# Patient Record
Sex: Female | Born: 1960
Health system: Southern US, Community
[De-identification: ages and names within clinical notes are randomized; demographics above are authoritative.]

## PROBLEM LIST (undated history)

## (undated) DIAGNOSIS — I10 Essential (primary) hypertension: Secondary | ICD-10-CM

## (undated) DIAGNOSIS — E7800 Pure hypercholesterolemia, unspecified: Secondary | ICD-10-CM

## (undated) HISTORY — PX: CYSTOSTOMY W/ BLADDER BIOPSY: SHX1431

## (undated) HISTORY — PX: CHOLECYSTECTOMY: SHX55

## (undated) HISTORY — PX: TONSILLECTOMY: SUR1361

---

## 1997-08-09 ENCOUNTER — Ambulatory Visit (HOSPITAL_COMMUNITY): Admission: RE | Admit: 1997-08-09 | Discharge: 1997-08-09 | Payer: Self-pay | Admitting: *Deleted

## 1998-08-15 ENCOUNTER — Other Ambulatory Visit: Admission: RE | Admit: 1998-08-15 | Discharge: 1998-08-15 | Payer: Self-pay | Admitting: Gynecology

## 1999-08-16 ENCOUNTER — Other Ambulatory Visit: Admission: RE | Admit: 1999-08-16 | Discharge: 1999-08-16 | Payer: Self-pay | Admitting: Gynecology

## 1999-09-13 ENCOUNTER — Ambulatory Visit (HOSPITAL_COMMUNITY): Admission: RE | Admit: 1999-09-13 | Discharge: 1999-09-13 | Payer: Self-pay | Admitting: *Deleted

## 2000-08-14 ENCOUNTER — Other Ambulatory Visit: Admission: RE | Admit: 2000-08-14 | Discharge: 2000-08-14 | Payer: Self-pay | Admitting: Gynecology

## 2001-09-08 ENCOUNTER — Other Ambulatory Visit: Admission: RE | Admit: 2001-09-08 | Discharge: 2001-09-08 | Payer: Self-pay | Admitting: Gynecology

## 2002-07-27 ENCOUNTER — Ambulatory Visit (HOSPITAL_COMMUNITY): Admission: RE | Admit: 2002-07-27 | Discharge: 2002-07-27 | Payer: Self-pay | Admitting: Family Medicine

## 2002-07-27 ENCOUNTER — Encounter: Payer: Self-pay | Admitting: Family Medicine

## 2003-01-13 ENCOUNTER — Other Ambulatory Visit: Admission: RE | Admit: 2003-01-13 | Discharge: 2003-01-13 | Payer: Self-pay | Admitting: Gynecology

## 2003-07-23 ENCOUNTER — Encounter: Admission: RE | Admit: 2003-07-23 | Discharge: 2003-07-23 | Payer: Self-pay | Admitting: Family Medicine

## 2004-09-03 ENCOUNTER — Other Ambulatory Visit: Admission: RE | Admit: 2004-09-03 | Discharge: 2004-09-03 | Payer: Self-pay | Admitting: Obstetrics and Gynecology

## 2006-10-27 ENCOUNTER — Ambulatory Visit (HOSPITAL_COMMUNITY): Admission: RE | Admit: 2006-10-27 | Discharge: 2006-10-27 | Payer: Self-pay | Admitting: Family Medicine

## 2006-10-27 ENCOUNTER — Encounter (INDEPENDENT_AMBULATORY_CARE_PROVIDER_SITE_OTHER): Payer: Self-pay | Admitting: Family Medicine

## 2006-10-27 ENCOUNTER — Ambulatory Visit: Payer: Self-pay | Admitting: *Deleted

## 2007-05-08 ENCOUNTER — Ambulatory Visit: Payer: Self-pay | Admitting: Vascular Surgery

## 2007-05-08 ENCOUNTER — Ambulatory Visit (HOSPITAL_COMMUNITY): Admission: RE | Admit: 2007-05-08 | Discharge: 2007-05-08 | Payer: Self-pay | Admitting: Family Medicine

## 2008-08-15 ENCOUNTER — Encounter: Admission: RE | Admit: 2008-08-15 | Discharge: 2008-08-15 | Payer: Self-pay | Admitting: Specialist

## 2009-03-17 ENCOUNTER — Encounter: Admission: RE | Admit: 2009-03-17 | Discharge: 2009-03-17 | Payer: Self-pay | Admitting: Family Medicine

## 2009-04-06 ENCOUNTER — Ambulatory Visit (HOSPITAL_COMMUNITY): Admission: RE | Admit: 2009-04-06 | Discharge: 2009-04-07 | Payer: Self-pay | Admitting: General Surgery

## 2009-04-06 ENCOUNTER — Encounter (INDEPENDENT_AMBULATORY_CARE_PROVIDER_SITE_OTHER): Payer: Self-pay | Admitting: General Surgery

## 2010-07-31 LAB — COMPREHENSIVE METABOLIC PANEL
BUN: 9 mg/dL (ref 6–23)
CO2: 29 mEq/L (ref 19–32)
Calcium: 9.3 mg/dL (ref 8.4–10.5)
Chloride: 106 mEq/L (ref 96–112)
Creatinine, Ser: 0.67 mg/dL (ref 0.4–1.2)
GFR calc non Af Amer: >60 mL/min (ref >60)
Total Bilirubin: 0.5 mg/dL (ref 0.3–1.2)

## 2010-07-31 LAB — CBC
HCT: 36.5 % (ref 36.0–46.0)
MCHC: 32.5 g/dL (ref 30.0–36.0)
MCV: 85 fL (ref 78.0–100.0)
Platelets: 388 10*3/uL (ref 150–400)
RBC: 4.3 MIL/uL (ref 3.87–5.11)
WBC: 5.8 10*3/uL (ref 4.0–10.5)

## 2010-07-31 LAB — DIFFERENTIAL
Basophils Absolute: 0 10*3/uL (ref 0.0–0.1)
Lymphocytes Relative: 38 % (ref 12–46)
Lymphs Abs: 2.2 10*3/uL (ref 0.7–4.0)
Neutro Abs: 3 10*3/uL (ref 1.7–7.7)
Neutrophils Relative %: 52 % (ref 43–77)

## 2011-07-04 ENCOUNTER — Other Ambulatory Visit: Payer: Self-pay | Admitting: Gastroenterology

## 2012-02-06 ENCOUNTER — Other Ambulatory Visit: Payer: Self-pay | Admitting: Obstetrics and Gynecology

## 2012-02-06 DIAGNOSIS — Z1231 Encounter for screening mammogram for malignant neoplasm of breast: Secondary | ICD-10-CM

## 2012-03-11 ENCOUNTER — Ambulatory Visit
Admission: RE | Admit: 2012-03-11 | Discharge: 2012-03-11 | Disposition: A | Discharge status: Home / Self Care | Payer: PRIVATE HEALTH INSURANCE | Source: Ambulatory Visit | Attending: Obstetrics and Gynecology | Admitting: Obstetrics and Gynecology

## 2012-03-11 DIAGNOSIS — Z1231 Encounter for screening mammogram for malignant neoplasm of breast: Secondary | ICD-10-CM

## 2012-03-16 ENCOUNTER — Other Ambulatory Visit: Payer: Self-pay | Admitting: Obstetrics and Gynecology

## 2012-03-16 DIAGNOSIS — R928 Other abnormal and inconclusive findings on diagnostic imaging of breast: Secondary | ICD-10-CM

## 2012-03-31 ENCOUNTER — Ambulatory Visit
Admission: RE | Admit: 2012-03-31 | Discharge: 2012-03-31 | Disposition: A | Discharge status: Home / Self Care | Payer: PRIVATE HEALTH INSURANCE | Source: Ambulatory Visit | Attending: Obstetrics and Gynecology | Admitting: Obstetrics and Gynecology

## 2012-03-31 DIAGNOSIS — R928 Other abnormal and inconclusive findings on diagnostic imaging of breast: Secondary | ICD-10-CM

## 2012-10-13 ENCOUNTER — Other Ambulatory Visit: Payer: Self-pay | Admitting: Obstetrics and Gynecology

## 2012-10-13 DIAGNOSIS — R921 Mammographic calcification found on diagnostic imaging of breast: Secondary | ICD-10-CM

## 2013-02-02 ENCOUNTER — Ambulatory Visit
Admission: RE | Admit: 2013-02-02 | Discharge: 2013-02-02 | Disposition: A | Discharge status: Home / Self Care | Payer: PRIVATE HEALTH INSURANCE | Source: Ambulatory Visit | Attending: Obstetrics and Gynecology | Admitting: Obstetrics and Gynecology

## 2013-02-02 DIAGNOSIS — R921 Mammographic calcification found on diagnostic imaging of breast: Secondary | ICD-10-CM

## 2013-02-24 ENCOUNTER — Ambulatory Visit: Payer: Self-pay | Admitting: Podiatry

## 2013-03-23 ENCOUNTER — Other Ambulatory Visit: Payer: Self-pay | Admitting: Otolaryngology

## 2013-03-23 DIAGNOSIS — K148 Other diseases of tongue: Secondary | ICD-10-CM

## 2013-03-24 ENCOUNTER — Ambulatory Visit
Admission: RE | Admit: 2013-03-24 | Discharge: 2013-03-24 | Disposition: A | Discharge status: Home / Self Care | Payer: BC Managed Care – PPO | Source: Ambulatory Visit | Attending: Otolaryngology | Admitting: Otolaryngology

## 2013-03-24 DIAGNOSIS — K148 Other diseases of tongue: Secondary | ICD-10-CM

## 2013-03-24 MED ORDER — IOHEXOL 300 MG/ML  SOLN
75.0000 mL | Freq: Once | INTRAMUSCULAR | Status: AC | PRN
  Administered 2013-03-24: 75 mL via INTRAVENOUS

## 2013-05-14 ENCOUNTER — Other Ambulatory Visit: Payer: Self-pay | Admitting: Family Medicine

## 2013-05-14 DIAGNOSIS — I7090 Unspecified atherosclerosis: Secondary | ICD-10-CM

## 2013-05-18 ENCOUNTER — Ambulatory Visit
Admission: RE | Admit: 2013-05-18 | Discharge: 2013-05-18 | Disposition: A | Discharge status: Home / Self Care | Payer: BC Managed Care – PPO | Source: Ambulatory Visit | Attending: Family Medicine | Admitting: Family Medicine

## 2013-05-18 DIAGNOSIS — I7090 Unspecified atherosclerosis: Secondary | ICD-10-CM

## 2013-10-26 ENCOUNTER — Other Ambulatory Visit: Payer: Self-pay | Admitting: Family Medicine

## 2013-10-26 DIAGNOSIS — R921 Mammographic calcification found on diagnostic imaging of breast: Secondary | ICD-10-CM

## 2013-11-05 ENCOUNTER — Inpatient Hospital Stay: Admission: RE | Admit: 2013-11-05 | Payer: BC Managed Care – PPO | Source: Ambulatory Visit

## 2014-03-11 ENCOUNTER — Ambulatory Visit
Admission: RE | Admit: 2014-03-11 | Discharge: 2014-03-11 | Disposition: A | Discharge status: Home / Self Care | Payer: BC Managed Care – PPO | Source: Ambulatory Visit | Attending: Family Medicine | Admitting: Family Medicine

## 2014-03-11 DIAGNOSIS — R921 Mammographic calcification found on diagnostic imaging of breast: Secondary | ICD-10-CM

## 2015-10-01 ENCOUNTER — Emergency Department (HOSPITAL_COMMUNITY): Payer: BLUE CROSS/BLUE SHIELD

## 2015-10-01 ENCOUNTER — Emergency Department (HOSPITAL_COMMUNITY)
Admission: EM | Admit: 2015-10-01 | Discharge: 2015-10-02 | Disposition: A | Discharge status: Home / Self Care | Payer: BLUE CROSS/BLUE SHIELD | Attending: Emergency Medicine | Admitting: Emergency Medicine

## 2015-10-01 ENCOUNTER — Encounter (HOSPITAL_COMMUNITY): Payer: Self-pay | Admitting: Emergency Medicine

## 2015-10-01 DIAGNOSIS — Z79899 Other long term (current) drug therapy: Secondary | ICD-10-CM | POA: Insufficient documentation

## 2015-10-01 DIAGNOSIS — R109 Unspecified abdominal pain: Secondary | ICD-10-CM | POA: Diagnosis present

## 2015-10-01 DIAGNOSIS — I10 Essential (primary) hypertension: Secondary | ICD-10-CM | POA: Diagnosis not present

## 2015-10-01 DIAGNOSIS — K5900 Constipation, unspecified: Secondary | ICD-10-CM | POA: Diagnosis not present

## 2015-10-01 HISTORY — DX: Essential (primary) hypertension: I10

## 2015-10-01 HISTORY — DX: Pure hypercholesterolemia, unspecified: E78.00

## 2015-10-01 LAB — CBC WITH DIFFERENTIAL/PLATELET
BASOS ABS: 0 10*3/uL (ref 0.0–0.1)
Basophils Relative: 0 %
Eosinophils Absolute: 0 10*3/uL (ref 0.0–0.7)
Eosinophils Relative: 0 %
HCT: 39.2 % (ref 36.0–46.0)
Hemoglobin: 13.9 g/dL (ref 12.0–15.0)
LYMPHS ABS: 1.7 10*3/uL (ref 0.7–4.0)
LYMPHS PCT: 10 %
MCH: 29.5 pg (ref 26.0–34.0)
MCHC: 35.5 g/dL (ref 30.0–36.0)
MCV: 83.2 fL (ref 78.0–100.0)
Monocytes Absolute: 0.7 10*3/uL (ref 0.1–1.0)
Monocytes Relative: 4 %
NEUTROS ABS: 14.3 10*3/uL — AB (ref 1.7–7.7)
NEUTROS PCT: 86 %
PLATELETS: 329 10*3/uL (ref 150–400)
RBC: 4.71 MIL/uL (ref 3.87–5.11)
RDW: 12.7 % (ref 11.5–15.5)
WBC: 16.7 10*3/uL — AB (ref 4.0–10.5)

## 2015-10-01 LAB — COMPREHENSIVE METABOLIC PANEL
ALT: 21 U/L (ref 14–54)
AST: 29 U/L (ref 15–41)
Albumin: 4.2 g/dL (ref 3.5–5.0)
Alkaline Phosphatase: 62 U/L (ref 38–126)
Anion gap: 10 (ref 5–15)
BUN: 19 mg/dL (ref 6–20)
CHLORIDE: 103 mmol/L (ref 101–111)
CO2: 24 mmol/L (ref 22–32)
Calcium: 9.3 mg/dL (ref 8.9–10.3)
Creatinine, Ser: 0.81 mg/dL (ref 0.44–1.00)
GFR calc Af Amer: >60 mL/min (ref >60)
Glucose, Bld: 99 mg/dL (ref 65–99)
POTASSIUM: 4.1 mmol/L (ref 3.5–5.1)
SODIUM: 137 mmol/L (ref 135–145)
Total Bilirubin: 0.9 mg/dL (ref 0.3–1.2)
Total Protein: 7.4 g/dL (ref 6.5–8.1)

## 2015-10-01 LAB — URINALYSIS, ROUTINE W REFLEX MICROSCOPIC
Bilirubin Urine: NEGATIVE
Glucose, UA: NEGATIVE mg/dL
Ketones, ur: NEGATIVE mg/dL
NITRITE: NEGATIVE
PROTEIN: NEGATIVE mg/dL
SPECIFIC GRAVITY, URINE: 1.01 (ref 1.005–1.030)
pH: 7 (ref 5.0–8.0)

## 2015-10-01 LAB — URINE MICROSCOPIC-ADD ON

## 2015-10-01 LAB — LIPASE, BLOOD: LIPASE: 22 U/L (ref 11–51)

## 2015-10-01 MED ORDER — FLEET ENEMA 7-19 GM/118ML RE ENEM
1.0000 | ENEMA | Freq: Once | RECTAL | Status: AC
  Administered 2015-10-01: 1 via RECTAL
  Filled 2015-10-01: qty 1

## 2015-10-01 MED ORDER — MORPHINE SULFATE (PF) 4 MG/ML IV SOLN
4.0000 mg | Freq: Once | INTRAVENOUS | Status: AC
Start: 2015-10-01 — End: 2015-10-01
  Administered 2015-10-01: 4 mg via INTRAVENOUS
  Filled 2015-10-01: qty 1

## 2015-10-01 MED ORDER — PROMETHAZINE HCL 25 MG PO TABS
25.0000 mg | ORAL_TABLET | Freq: Once | ORAL | Status: AC
  Administered 2015-10-01: 25 mg via ORAL
  Filled 2015-10-01: qty 1

## 2015-10-01 MED ORDER — MORPHINE SULFATE (PF) 4 MG/ML IV SOLN
4.0000 mg | Freq: Once | INTRAVENOUS | Status: AC
  Administered 2015-10-01: 4 mg via INTRAVENOUS
  Filled 2015-10-01: qty 1

## 2015-10-01 MED ORDER — MAGNESIUM CITRATE PO SOLN
1.0000 | Freq: Once | ORAL | Status: AC
  Administered 2015-10-01: 1 via ORAL
  Filled 2015-10-01: qty 296

## 2015-10-01 MED ORDER — ONDANSETRON 4 MG PO TBDP
4.0000 mg | ORAL_TABLET | Freq: Once | ORAL | Status: AC
  Administered 2015-10-01: 4 mg via ORAL
  Filled 2015-10-01: qty 1

## 2015-10-01 MED ORDER — MAGNESIUM CITRATE PO SOLN
1.0000 | Freq: Once | ORAL | Status: DC
  Filled 2015-10-01: qty 296

## 2015-10-01 NOTE — ED Notes (Signed)
Bed: [facility] Expected date:  Expected time:  Means of arrival:  Comments: 

## 2015-10-01 NOTE — ED Provider Notes (Signed)
CSN: [id number]     Arrival date & time 10/01/15  1647 History   First MD Initiated Contact with Patient 10/01/15 1722     Chief Complaint  Patient presents with  . Constipation  . Abdominal Pain   HPI  Gina Watson is a 55 year old female with PMHx of HTN and PSHx of cholecystectomy and cesarean x 2 presenting with abdominal bloating, constipation and nausea. She reports her last bowel movement was two days ago. She states that her abdomen feels full but it is not painful. She has had an urge to have a bowel movement all morning with significant straining. She states that no stool has passed and she is now having rectal pain and fullness. Denies blood from rectum. She took a stool softener and OTC laxative which did not relieve her symptoms. She also complains of difficulty urinating. She states that she is able to urinate with straining. Denies dysuria or hematuria. Reports history of hemorrhoids and constipation. She does not take daily constipation medications. Denies fevers, chills, dizziness, syncope, chest pain, SOB, vomiting. She has never followed with a gastroenterologist.   Past Medical History  Diagnosis Date  . Hypertension   . High cholesterol    Past Surgical History  Procedure Laterality Date  . Cesarean section    . Tonsillectomy    . Cystostomy w/ bladder biopsy    . Cholecystectomy     History reviewed. No pertinent family history. Social History  Substance Use Topics  . Smoking status: None  . Smokeless tobacco: None  . Alcohol Use: None   OB History    No data available     Review of Systems  All other systems reviewed and are negative.     Allergies  Hydrocodone; Penicillins; and Sulfa antibiotics  Home Medications   Prior to Admission medications   Medication Sig Start Date End Date Taking? Authorizing Provider  Bisacodyl (LAXATIVE PO) Take 1 tablet by mouth daily as needed (constipation).   Yes Historical Provider, MD  BYSTOLIC 5 MG tablet Take 5 mg  by mouth daily. 08/28/15  Yes Historical Provider, MD  Calcium Carbonate (CALCIUM 600 PO) Take 600 mg by mouth daily.   Yes Historical Provider, MD  escitalopram (LEXAPRO) 10 MG tablet Take 10 mg by mouth daily. 07/24/15  Yes Historical Provider, MD  losartan (COZAAR) 25 MG tablet Take 25 mg by mouth daily. 07/29/15  Yes Historical Provider, MD  Multiple Vitamins-Minerals (PRESERVISION/LUTEIN) CAPS Take 1 capsule by mouth 2 (two) times daily.   Yes Historical Provider, MD  pravastatin (PRAVACHOL) 40 MG tablet Take 40 mg by mouth daily. 07/24/15  Yes Historical Provider, MD  Probiotic Product (PROBIOTIC PO) Take 1 capsule by mouth daily.   Yes Historical Provider, MD   BP 137/68 mmHg  Pulse 95  Temp(Src) 97.9 F (36.6 C) (Oral)  Resp 21  SpO2 97% Physical Exam  Constitutional: She appears well-developed and well-nourished. No distress.  HENT:  Head: Normocephalic and atraumatic.  Eyes: Conjunctivae are normal. Right eye exhibits no discharge. Left eye exhibits no discharge. No scleral icterus.  Neck: Normal range of motion.  Cardiovascular: Normal rate and regular rhythm.   Pulmonary/Chest: Effort normal. No respiratory distress.  Abdominal: Soft. Bowel sounds are decreased. There is no tenderness. There is no rigidity, no rebound and no guarding.  Abdomen is soft and non-tender. Decreased bowel sounds.   Genitourinary:  One external non-thrombosed hemorrhoid  Musculoskeletal: Normal range of motion.  Neurological: She is alert. Coordination  normal.  Skin: Skin is warm and dry.  Psychiatric: She has a normal mood and affect. Her behavior is normal.  Nursing note and vitals reviewed.   ED Course  Procedures (including critical care time) Labs Review Labs Reviewed  URINALYSIS, ROUTINE W REFLEX MICROSCOPIC (NOT AT [facility]) - Abnormal; Notable for the following:    APPearance CLOUDY (*)    Hgb urine dipstick TRACE (*)    Leukocytes, UA SMALL (*)    All other components within normal limits   CBC WITH DIFFERENTIAL/PLATELET - Abnormal; Notable for the following:    WBC 16.7 (*)    Neutro Abs 14.3 (*)    All other components within normal limits  URINE MICROSCOPIC-ADD ON - Abnormal; Notable for the following:    Squamous Epithelial / LPF 0-5 (*)    Bacteria, UA MANY (*)    All other components within normal limits  COMPREHENSIVE METABOLIC PANEL  LIPASE, BLOOD    Imaging Review Dg Abd 1 View  10/01/2015  CLINICAL DATA:  55 year old female with constipation and abdominal pain. Patient is unable to urinate. EXAM: ABDOMEN - 1 VIEW COMPARISON:  None. FINDINGS: There is moderate stool in the distal colon and rectosigmoid. No evidence of bowel dilatation. No free air or radiopaque calculi identified. Right upper quadrant cholecystectomy clips noted. There is mild degenerative changes of the spine. No acute fracture. IMPRESSION: Moderate stool within the distal colon.  No bowel obstruction. Electronically Signed   By: Elgie CollardArash  Radparvar M.D.   On: 10/01/2015 18:51   I have personally reviewed and evaluated these images and lab results as part of my medical decision-making.   EKG Interpretation None      MDM   Final diagnoses:  Constipation, unspecified constipation type   55 year old female presenting with constipation and rectal pain. Afebrile and hemodynamically stable. Abdomen is soft, non-tender without peritoneal signs suggesting surgical abdomen. One external hemorrhoid noted. Leukocytosis. CMP reassuring. UA with small leuks, many bacteria and squam cells present. Not convincing of infection; will send for culture. Upright abdomen without signs of SBO. Shows moderate stool at distal colon consistent with constipation. Pt given mag citrate and fleet enema in ED with multiple loose stools. Will discharge with instructions for miralax at 6 capfuls of miralax in a 32 oz gatorade and drink the whole beverage followed by 3 capfuls twice a day for the the next week. Given pt's frequent  constipation, suggested follow up with gastroenterologist and referral information given. Return precautions given in discharge paperwork and discussed with pt at bedside. Pt stable for discharge       Alveta HeimlichStevi Lynnsey Barbara, PA-C 10/02/15 1225  Doug SouSam Jacubowitz, MD 10/03/15 [phone]

## 2015-10-01 NOTE — ED Notes (Addendum)
Per EMS, from home with constipation since this morning. Took laxative and stool softener without relief, after straining began having rectal pain, 8/10. Abdominal pain 5/10. Also is currently having difficulty urinating and worried she may have a UTI. C/o nausea, "it feels like my insides are coming out."

## 2015-10-02 NOTE — Discharge Instructions (Signed)
Take 6 capfuls of miralax in a 32 oz gatorade and drink the whole beverage followed by 3 capfuls twice a day for the the next week. Follow up with gastroenterology for your chronic constipation.   Constipation, Adult Constipation is when a person has fewer than three bowel movements a week, has difficulty having a bowel movement, or has stools that are dry, hard, or larger than normal. As people grow older, constipation is more common. A low-fiber diet, not taking in enough fluids, and taking certain medicines may make constipation worse.  CAUSES   Certain medicines, such as antidepressants, pain medicine, iron supplements, antacids, and water pills.   Certain diseases, such as diabetes, irritable bowel syndrome (IBS), thyroid disease, or depression.   Not drinking enough water.   Not eating enough fiber-rich foods.   Stress or travel.   Lack of physical activity or exercise.   Ignoring the urge to have a bowel movement.   Using laxatives too much.  SIGNS AND SYMPTOMS   Having fewer than three bowel movements a week.   Straining to have a bowel movement.   Having stools that are hard, dry, or larger than normal.   Feeling full or bloated.   Pain in the lower abdomen.   Not feeling relief after having a bowel movement.  DIAGNOSIS  Your health care provider will take a medical history and perform a physical exam. Further testing may be done for severe constipation. Some tests may include:  A barium enema X-ray to examine your rectum, colon, and, sometimes, your small intestine.   A sigmoidoscopy to examine your lower colon.   A colonoscopy to examine your entire colon. TREATMENT  Treatment will depend on the severity of your constipation and what is causing it. Some dietary treatments include drinking more fluids and eating more fiber-rich foods. Lifestyle treatments may include regular exercise. If these diet and lifestyle recommendations do not help, your  health care provider may recommend taking over-the-counter laxative medicines to help you have bowel movements. Prescription medicines may be prescribed if over-the-counter medicines do not work.  HOME CARE INSTRUCTIONS   Eat foods that have a lot of fiber, such as fruits, vegetables, whole grains, and beans.  Limit foods high in fat and processed sugars, such as french fries, hamburgers, cookies, candies, and soda.   A fiber supplement may be added to your diet if you cannot get enough fiber from foods.   Drink enough fluids to keep your urine clear or pale yellow.   Exercise regularly or as directed by your health care provider.   Go to the restroom when you have the urge to go. Do not hold it.   Only take over-the-counter or prescription medicines as directed by your health care provider. Do not take other medicines for constipation without talking to your health care provider first.  SEEK IMMEDIATE MEDICAL CARE IF:   You have bright red blood in your stool.   Your constipation lasts for more than 4 days or gets worse.   You have abdominal or rectal pain.   You have thin, pencil-like stools.   You have unexplained weight loss. MAKE SURE YOU:   Understand these instructions.  Will watch your condition.  Will get help right away if you are not doing well or get worse.   This information is not intended to replace advice given to you by your health care provider. Make sure you discuss any questions you have with your health care provider.  Document Released: 01/12/2004 Document Revised: 05/06/2014 Document Reviewed: 01/25/2013 Elsevier Interactive Patient Education [employer].

## 2017-04-16 DIAGNOSIS — H3554 Dystrophies primarily involving the retinal pigment epithelium: Secondary | ICD-10-CM | POA: Diagnosis not present

## 2017-04-16 DIAGNOSIS — H25013 Cortical age-related cataract, bilateral: Secondary | ICD-10-CM | POA: Diagnosis not present

## 2017-04-16 DIAGNOSIS — H5203 Hypermetropia, bilateral: Secondary | ICD-10-CM | POA: Diagnosis not present

## 2017-04-16 DIAGNOSIS — H53002 Unspecified amblyopia, left eye: Secondary | ICD-10-CM | POA: Diagnosis not present

## 2017-06-11 DIAGNOSIS — L570 Actinic keratosis: Secondary | ICD-10-CM | POA: Diagnosis not present

## 2017-06-11 DIAGNOSIS — D485 Neoplasm of uncertain behavior of skin: Secondary | ICD-10-CM | POA: Diagnosis not present

## 2017-07-01 DIAGNOSIS — L570 Actinic keratosis: Secondary | ICD-10-CM | POA: Diagnosis not present

## 2017-08-27 DIAGNOSIS — I10 Essential (primary) hypertension: Secondary | ICD-10-CM | POA: Diagnosis not present

## 2017-08-27 DIAGNOSIS — E559 Vitamin D deficiency, unspecified: Secondary | ICD-10-CM | POA: Diagnosis not present

## 2017-08-27 DIAGNOSIS — F419 Anxiety disorder, unspecified: Secondary | ICD-10-CM | POA: Diagnosis not present

## 2017-08-27 DIAGNOSIS — E7800 Pure hypercholesterolemia, unspecified: Secondary | ICD-10-CM | POA: Diagnosis not present

## 2017-09-18 DIAGNOSIS — I10 Essential (primary) hypertension: Secondary | ICD-10-CM | POA: Diagnosis not present

## 2017-10-07 DIAGNOSIS — R944 Abnormal results of kidney function studies: Secondary | ICD-10-CM | POA: Diagnosis not present

## 2017-11-11 DIAGNOSIS — R7309 Other abnormal glucose: Secondary | ICD-10-CM | POA: Diagnosis not present

## 2017-11-11 DIAGNOSIS — E559 Vitamin D deficiency, unspecified: Secondary | ICD-10-CM | POA: Diagnosis not present

## 2018-01-06 DIAGNOSIS — D1801 Hemangioma of skin and subcutaneous tissue: Secondary | ICD-10-CM | POA: Diagnosis not present

## 2018-01-06 DIAGNOSIS — L821 Other seborrheic keratosis: Secondary | ICD-10-CM | POA: Diagnosis not present

## 2018-01-06 DIAGNOSIS — L814 Other melanin hyperpigmentation: Secondary | ICD-10-CM | POA: Diagnosis not present

## 2018-03-05 DIAGNOSIS — H2513 Age-related nuclear cataract, bilateral: Secondary | ICD-10-CM | POA: Diagnosis not present

## 2018-03-05 DIAGNOSIS — H353124 Nonexudative age-related macular degeneration, left eye, advanced atrophic with subfoveal involvement: Secondary | ICD-10-CM | POA: Diagnosis not present

## 2018-03-05 DIAGNOSIS — H3554 Dystrophies primarily involving the retinal pigment epithelium: Secondary | ICD-10-CM | POA: Diagnosis not present

## 2018-03-05 DIAGNOSIS — H3550 Unspecified hereditary retinal dystrophy: Secondary | ICD-10-CM | POA: Diagnosis not present

## 2018-03-05 DIAGNOSIS — H353112 Nonexudative age-related macular degeneration, right eye, intermediate dry stage: Secondary | ICD-10-CM | POA: Diagnosis not present

## 2018-05-12 DIAGNOSIS — E559 Vitamin D deficiency, unspecified: Secondary | ICD-10-CM | POA: Diagnosis not present

## 2018-05-12 DIAGNOSIS — F419 Anxiety disorder, unspecified: Secondary | ICD-10-CM | POA: Diagnosis not present

## 2018-05-12 DIAGNOSIS — E7800 Pure hypercholesterolemia, unspecified: Secondary | ICD-10-CM | POA: Diagnosis not present

## 2018-05-12 DIAGNOSIS — R7303 Prediabetes: Secondary | ICD-10-CM | POA: Diagnosis not present

## 2018-05-12 DIAGNOSIS — M2550 Pain in unspecified joint: Secondary | ICD-10-CM | POA: Diagnosis not present

## 2018-05-12 DIAGNOSIS — I10 Essential (primary) hypertension: Secondary | ICD-10-CM | POA: Diagnosis not present

## 2018-08-06 DIAGNOSIS — R0681 Apnea, not elsewhere classified: Secondary | ICD-10-CM | POA: Diagnosis not present

## 2018-08-26 DIAGNOSIS — G4733 Obstructive sleep apnea (adult) (pediatric): Secondary | ICD-10-CM | POA: Diagnosis not present

## 2018-08-27 DIAGNOSIS — G4733 Obstructive sleep apnea (adult) (pediatric): Secondary | ICD-10-CM | POA: Diagnosis not present

## 2018-09-03 DIAGNOSIS — G4733 Obstructive sleep apnea (adult) (pediatric): Secondary | ICD-10-CM | POA: Diagnosis not present

## 2018-10-04 DIAGNOSIS — G4733 Obstructive sleep apnea (adult) (pediatric): Secondary | ICD-10-CM | POA: Diagnosis not present

## 2018-11-03 DIAGNOSIS — G4733 Obstructive sleep apnea (adult) (pediatric): Secondary | ICD-10-CM | POA: Diagnosis not present

## 2018-11-24 DIAGNOSIS — G4733 Obstructive sleep apnea (adult) (pediatric): Secondary | ICD-10-CM | POA: Diagnosis not present

## 2018-12-04 DIAGNOSIS — G4733 Obstructive sleep apnea (adult) (pediatric): Secondary | ICD-10-CM | POA: Diagnosis not present

## 2019-01-14 DIAGNOSIS — I10 Essential (primary) hypertension: Secondary | ICD-10-CM | POA: Diagnosis not present

## 2019-01-14 DIAGNOSIS — Z0000 Encounter for general adult medical examination without abnormal findings: Secondary | ICD-10-CM | POA: Diagnosis not present

## 2019-01-14 DIAGNOSIS — R7303 Prediabetes: Secondary | ICD-10-CM | POA: Diagnosis not present

## 2019-01-14 DIAGNOSIS — E7800 Pure hypercholesterolemia, unspecified: Secondary | ICD-10-CM | POA: Diagnosis not present

## 2019-01-14 DIAGNOSIS — F419 Anxiety disorder, unspecified: Secondary | ICD-10-CM | POA: Diagnosis not present

## 2019-01-15 DIAGNOSIS — H2513 Age-related nuclear cataract, bilateral: Secondary | ICD-10-CM | POA: Diagnosis not present

## 2019-01-15 DIAGNOSIS — H524 Presbyopia: Secondary | ICD-10-CM | POA: Diagnosis not present

## 2020-11-17 ENCOUNTER — Emergency Department (HOSPITAL_BASED_OUTPATIENT_CLINIC_OR_DEPARTMENT_OTHER): Payer: No Typology Code available for payment source

## 2020-11-17 ENCOUNTER — Ambulatory Visit (HOSPITAL_COMMUNITY)
Admission: EM | Admit: 2020-11-17 | Discharge: 2020-11-17 | Disposition: A | Discharge status: Home / Self Care | Payer: No Typology Code available for payment source

## 2020-11-17 ENCOUNTER — Other Ambulatory Visit: Payer: Self-pay

## 2020-11-17 ENCOUNTER — Emergency Department (HOSPITAL_BASED_OUTPATIENT_CLINIC_OR_DEPARTMENT_OTHER)
Admission: EM | Admit: 2020-11-17 | Discharge: 2020-11-17 | Disposition: A | Discharge status: Home / Self Care | Payer: No Typology Code available for payment source | Attending: Emergency Medicine | Admitting: Emergency Medicine

## 2020-11-17 DIAGNOSIS — I10 Essential (primary) hypertension: Secondary | ICD-10-CM | POA: Insufficient documentation

## 2020-11-17 DIAGNOSIS — M79604 Pain in right leg: Secondary | ICD-10-CM

## 2020-11-17 DIAGNOSIS — Z79899 Other long term (current) drug therapy: Secondary | ICD-10-CM | POA: Diagnosis not present

## 2020-11-17 DIAGNOSIS — M79661 Pain in right lower leg: Secondary | ICD-10-CM | POA: Insufficient documentation

## 2020-11-17 NOTE — Discharge Instructions (Addendum)
Try taking over-the-counter ibuprofen or Naprosyn to help with your leg cramping and pain.  Follow-up with an a sports medicine doctor or your primary care doctor for further evaluation if the symptoms persist.  The ultrasound today did not show any signs of blood clot

## 2020-11-17 NOTE — ED Provider Notes (Signed)
[facility] EMERGENCY DEPT Provider Note   CSN: [id number] Arrival date & time: 11/17/20  1932     History Chief Complaint  Patient presents with   Leg Swelling    right    Gina Watson is a 60 y.o. female.  HPI  Patient has been having intermittent cramping pain in her right calf for the last couple of weeks.  Patient states the symptoms were intermittent not too severe.  In last few days she has noticed more discomfort.  Family looked at the back of her leg and thought it was swollen.  Patient does have history of prior DVTs of she wanted to come in to get checked.  She is not have any fevers or chills.  No rashes.  She denies any injuries.  Past Medical History:  Diagnosis Date   High cholesterol    Hypertension     There are no problems to display for this patient.   Past Surgical History:  Procedure Laterality Date   CESAREAN SECTION     CHOLECYSTECTOMY     CYSTOSTOMY W/ BLADDER BIOPSY     TONSILLECTOMY       OB History   No obstetric history on file.     No family history on file.     Home Medications Prior to Admission medications   Medication Sig Start Date End Date Taking? Authorizing Provider  Bisacodyl (LAXATIVE PO) Take 1 tablet by mouth daily as needed (constipation).    [provider]  BYSTOLIC 5 MG tablet Take 5 mg by mouth daily. 08/28/15   [provider]  Calcium Carbonate (CALCIUM 600 PO) Take 600 mg by mouth daily.    [provider]  escitalopram (LEXAPRO) 10 MG tablet Take 10 mg by mouth daily. 07/24/15   [provider]  losartan (COZAAR) 25 MG tablet Take 25 mg by mouth daily. 07/29/15   [provider]  Multiple Vitamins-Minerals (PRESERVISION/LUTEIN) CAPS Take 1 capsule by mouth 2 (two) times daily.    [provider]  pravastatin (PRAVACHOL) 40 MG tablet Take 40 mg by mouth daily. 07/24/15   [provider]  Probiotic Product (PROBIOTIC PO) Take 1 capsule by  mouth daily.    [provider]    Allergies    Hydrocodone, Penicillins, and Sulfa antibiotics  Review of Systems   Review of Systems  All other systems reviewed and are negative.  Physical Exam Updated Vital Signs BP 139/66 (BP Location: Left Arm)   Pulse 68   Temp 99.2 F (37.3 C) (Oral)   Resp 18   Ht 1.575 m (5\' 2" )   Wt 82.1 kg   SpO2 99%   BMI 33.11 kg/m   Physical Exam Vitals and nursing note reviewed.  Constitutional:      General: She is not in acute distress.    Appearance: She is well-developed.  HENT:     Head: Normocephalic and atraumatic.     Right Ear: External ear normal.     Left Ear: External ear normal.  Eyes:     General: No scleral icterus.       Right eye: No discharge.        Left eye: No discharge.     Conjunctiva/sclera: Conjunctivae normal.  Neck:     Trachea: No tracheal deviation.  Cardiovascular:     Rate and Rhythm: Normal rate.  Pulmonary:     Effort: Pulmonary effort is normal. No respiratory distress.     Breath sounds:  No stridor.  Abdominal:     General: There is no distension.  Musculoskeletal:        General: Tenderness present. No swelling or deformity.     Cervical back: Neck supple.     Comments: Mild tenderness palpation right posterior calf, no erythema, no induration  Skin:    General: Skin is warm and dry.     Findings: No rash.  Neurological:     Mental Status: She is alert.     Cranial Nerves: Cranial nerve deficit: no gross deficits.    ED Results / Procedures / Treatments   Labs (all labs ordered are listed, but only abnormal results are displayed) Labs Reviewed - No data to display  EKG None  Radiology US Venous Img Lower Unilateral Right  Result Date: 11/17/2020 CLINICAL DATA:  Posterior right calf pain x2 weeks. EXAM: RIGHT LOWER EXTREMITY VENOUS DOPPLER ULTRASOUND TECHNIQUE: Gray-scale sonography with compression, as well as color and duplex ultrasound, were performed to evaluate the  deep venous system(s) from the level of the common femoral vein through the popliteal and proximal calf veins. COMPARISON:  None. FINDINGS: VENOUS Normal compressibility of the RIGHT common femoral, superficial femoral, and popliteal veins, as well as the visualized RIGHT calf veins. Visualized portions of the RIGHT profunda femoral vein and RIGHT great saphenous vein are unremarkable. No filling defects to suggest DVT on grayscale or color Doppler imaging. Doppler waveforms show normal direction of venous flow, normal respiratory plasticity and response to augmentation. Limited views of the contralateral common femoral vein are unremarkable. OTHER None. Limitations: none IMPRESSION: Negative. Electronically Signed   By: Aram Candela M.D.   On: 11/17/2020 21:04    Procedures Procedures   Medications Ordered in ED Medications - No data to display  ED Course  I have reviewed the triage vital signs and the nursing notes.  Pertinent labs & imaging results that were available during my care of the patient were reviewed by me and considered in my medical decision making (see chart for details).    MDM Rules/Calculators/A&P                           Patient had a Doppler study and it was negative for DVT.  She does not have evidence of infection on exam.  She has normal perfusion.  No recent injury .  we will have her take a course of NSAIDs.  Consider following up with sports medicine for further evaluation if symptoms persist Final Clinical Impression(s) / ED Diagnoses Final diagnoses:  Pain of right lower extremity    Rx / DC Orders ED Discharge Orders     None        Linwood Dibbles, MD 11/17/20 2202

## 2020-11-17 NOTE — ED Triage Notes (Signed)
Pt to ED from home with c/o pain/swelling to right leg which pt believes to be possible DVT. Pt has hx of DVT in her left leg. Pt states she started working from home and has been sitting more and started developing pain and swelling in the past week. Pt has equal palpable pedal pulses.

## 2020-11-30 ENCOUNTER — Other Ambulatory Visit: Payer: Self-pay | Admitting: Family Medicine

## 2020-11-30 DIAGNOSIS — Z1231 Encounter for screening mammogram for malignant neoplasm of breast: Secondary | ICD-10-CM

## 2020-12-01 ENCOUNTER — Other Ambulatory Visit: Payer: Self-pay

## 2020-12-01 ENCOUNTER — Ambulatory Visit
Admission: RE | Admit: 2020-12-01 | Discharge: 2020-12-01 | Disposition: A | Discharge status: Home / Self Care | Payer: No Typology Code available for payment source | Source: Ambulatory Visit | Attending: Family Medicine | Admitting: Family Medicine

## 2020-12-01 DIAGNOSIS — Z1231 Encounter for screening mammogram for malignant neoplasm of breast: Secondary | ICD-10-CM

## 2021-07-10 ENCOUNTER — Encounter (INDEPENDENT_AMBULATORY_CARE_PROVIDER_SITE_OTHER): Payer: No Typology Code available for payment source | Admitting: Ophthalmology

## 2021-07-30 ENCOUNTER — Encounter (INDEPENDENT_AMBULATORY_CARE_PROVIDER_SITE_OTHER): Payer: No Typology Code available for payment source | Admitting: Ophthalmology

## 2021-07-30 ENCOUNTER — Ambulatory Visit (INDEPENDENT_AMBULATORY_CARE_PROVIDER_SITE_OTHER): Payer: No Typology Code available for payment source | Admitting: Ophthalmology

## 2021-07-30 ENCOUNTER — Encounter (INDEPENDENT_AMBULATORY_CARE_PROVIDER_SITE_OTHER): Payer: Self-pay | Admitting: Ophthalmology

## 2021-07-30 ENCOUNTER — Other Ambulatory Visit: Payer: Self-pay

## 2021-07-30 DIAGNOSIS — H353124 Nonexudative age-related macular degeneration, left eye, advanced atrophic with subfoveal involvement: Secondary | ICD-10-CM | POA: Diagnosis not present

## 2021-07-30 DIAGNOSIS — G4733 Obstructive sleep apnea (adult) (pediatric): Secondary | ICD-10-CM

## 2021-07-30 DIAGNOSIS — H2513 Age-related nuclear cataract, bilateral: Secondary | ICD-10-CM | POA: Diagnosis not present

## 2021-07-30 DIAGNOSIS — H353111 Nonexudative age-related macular degeneration, right eye, early dry stage: Secondary | ICD-10-CM | POA: Diagnosis not present

## 2021-07-30 DIAGNOSIS — H2511 Age-related nuclear cataract, right eye: Secondary | ICD-10-CM | POA: Insufficient documentation

## 2021-07-30 DIAGNOSIS — Z9989 Dependence on other enabling machines and devices: Secondary | ICD-10-CM

## 2021-07-30 NOTE — Assessment & Plan Note (Signed)
Patient finally did follow advice and have herself checked for OSA to diminish the risk of macular hypoxia. ? ?She found to have severe sleep apnea and is on successful CPAP use although she gives her some permission not to like it ?

## 2021-07-30 NOTE — Assessment & Plan Note (Signed)
No signs of CNVM, significant progression since 2012 initial diagnosis. ? ? ?

## 2021-07-30 NOTE — Assessment & Plan Note (Signed)
OU, with visually significant cataract although vision limited OS by central geographic atrophy of dry AMD. ? ?I agree with Dr. Sinda Du proceed with cataract surgery in the left eye first followed thereafter by the right eye in order to maximize this patient's potential visual acuity for the remainder of life ?

## 2021-07-30 NOTE — Assessment & Plan Note (Signed)
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  No significant RPE atrophy and preserved acuity ? ?Likely with cataract surgery to improve acuity ?

## 2021-07-30 NOTE — Progress Notes (Signed)
? ? ?07/30/2021 ? ?  ? ?CHIEF COMPLAINT ?Patient presents for  ?Chief Complaint  ?Patient presents with  ? Retina Follow Up  ? ? ? ? ?HISTORY OF PRESENT ILLNESS: ?Gina Watson is a 61 y.o. female who presents to the clinic today for:  ? ?HPI   ? ? Retina Follow Up   ? ?      ? Diagnosis: Other  ? ?  ?  ? ? Comments   ?3 yr fu oct (68mos post ref opth appnt). ?Pt stated a lot of blurriness and dry eyes.  ?Pt is going to have cataract surgery on April 26th on the left eye and the right eye will be on may 24th. ?Dr. Cathey Endow prescribed eye drops but pt doesn't remember the name for dry eyes. ?Pt denies floaters and FOL. ? ? ? ? ? ? ?  ?  ?Last edited by Angeline Slim on 07/30/2021  3:19 PM.  ?  ? ? ?Referring physician: ?Sinda Du, MD ?[address] ?[place],  [place] [address] ? ?HISTORICAL INFORMATION:  ? ?Selected notes from the MEDICAL RECORD NUMBER ?  ?   ? ?CURRENT MEDICATIONS: ?No current outpatient medications on file. (Ophthalmic Drugs)  ? ?No current facility-administered medications for this visit. (Ophthalmic Drugs)  ? ?Current Outpatient Medications (Other)  ?Medication Sig  ? Bisacodyl (LAXATIVE PO) Take 1 tablet by mouth daily as needed (constipation).  ? BYSTOLIC 5 MG tablet Take 5 mg by mouth daily.  ? Calcium Carbonate (CALCIUM 600 PO) Take 600 mg by mouth daily.  ? escitalopram (LEXAPRO) 10 MG tablet Take 10 mg by mouth daily.  ? losartan (COZAAR) 25 MG tablet Take 25 mg by mouth daily.  ? Multiple Vitamins-Minerals (PRESERVISION/LUTEIN) CAPS Take 1 capsule by mouth 2 (two) times daily.  ? pravastatin (PRAVACHOL) 40 MG tablet Take 40 mg by mouth daily.  ? Probiotic Product (PROBIOTIC PO) Take 1 capsule by mouth daily.  ? ?No current facility-administered medications for this visit. (Other)  ? ? ? ? ?REVIEW OF SYSTEMS: ?ROS   ?Negative for: Constitutional, Gastrointestinal, Neurological, Skin, Genitourinary, Musculoskeletal, HENT, Endocrine, Cardiovascular, Eyes, Respiratory, Psychiatric, Allergic/Imm, Heme/Lymph ?Last  edited by Angeline Slim on 07/30/2021  3:18 PM.  ?  ? ? ? ?ALLERGIES ?Allergies  ?Allergen Reactions  ? Hydrocodone Itching and Other (See Comments)  ?  "I lose my voice."  ? Penicillins Rash  ?  Has patient had a PCN reaction causing immediate rash, facial/tongue/throat swelling, SOB or lightheadedness with hypotension: No ?Has patient had a PCN reaction causing severe rash involving mucus membranes or skin necrosis: No ?Has patient had a PCN reaction that required hospitalization Yes ?Has patient had a PCN reaction occurring within the last 10 years: No ?If all of the above answers are "NO", then may proceed with Cephalosporin use. ?  ? Sulfa Antibiotics Rash  ? ? ?PAST MEDICAL HISTORY ?Past Medical History:  ?Diagnosis Date  ? High cholesterol   ? Hypertension   ? ?Past Surgical History:  ?Procedure Laterality Date  ? CESAREAN SECTION    ? CHOLECYSTECTOMY    ? CYSTOSTOMY W/ BLADDER BIOPSY    ? TONSILLECTOMY    ? ? ?FAMILY HISTORY ?Family History  ?Problem Relation Age of Onset  ? Breast cancer Neg Hx   ? ? ?SOCIAL HISTORY ?Social History  ? ?Tobacco Use  ? Smoking status: Never  ?Substance Use Topics  ? Alcohol use: Yes  ?  Alcohol/week: 1.0 standard drink  ?  Types:  1 Glasses of wine per week  ? ?  ? ?  ? ?OPHTHALMIC EXAM: ? ?Base Eye Exam   ? ? Visual Acuity (ETDRS)   ? ?   Right Left  ? Dist cc 20/50 CF at 2'  ? Dist ph cc NI   ? ? Correction: Glasses  ? ?  ?  ? ? Tonometry (Tonopen, 3:27 PM)   ? ?   Right Left  ? Pressure 18 16  ? ?  ?  ? ? Pupils   ? ?   Dark Light Shape React APD  ? Right 4 3 Round Brisk None  ? Left 4 3 Round Brisk None  ? ?  ?  ? ? Visual Fields   ? ?   Left Right  ?  Full Full  ? ?  ?  ? ? Extraocular Movement   ? ?   Right Left  ?  Full Full  ? ?  ?  ? ? Neuro/Psych   ? ? Oriented x3: Yes  ? ?  ?  ? ? Dilation   ? ? Both eyes: 1.0% Mydriacyl, 2.5% Phenylephrine @ 3:27 PM  ? ?  ?  ? ?  ? ?Slit Lamp and Fundus Exam   ? ? External Exam   ? ?   Right Left  ? External Normal Normal  ? ?  ?  ? ? Slit  Lamp Exam   ? ?   Right Left  ? Lids/Lashes Normal Normal  ? Conjunctiva/Sclera White and quiet White and quiet  ? Cornea Clear Clear  ? Anterior Chamber Deep and quiet Deep and quiet  ? Iris Round and reactive Round and reactive  ? Lens 2.5+ Nuclear sclerosis 2.5+ Nuclear sclerosis  ? Anterior Vitreous Normal Normal  ? ?  ?  ? ? Fundus Exam   ? ?   Right Left  ? Posterior Vitreous Normal Normal  ? Disc Normal Normal  ? C/D Ratio 0.2 0.2  ? Macula Normal clinically minor atrophy Geographic atrophy, roughly 4-5 disc areas in size  ? Vessels Normal Normal  ? Periphery Normal Normal  ? ?  ?  ? ?  ? ? ?IMAGING AND PROCEDURES  ?Imaging and Procedures for 07/30/21 ? ?OCT, Retina - OU - Both Eyes   ? ?   ?Right Eye ?Quality was good. Scan locations included subfoveal. Central Foveal Thickness: 172. Progression has been stable. Findings include abnormal foveal contour.  ? ?Left Eye ?Quality was borderline. Scan locations included subfoveal. Progression has worsened. Findings include abnormal foveal contour.  ? ?Notes ?Diffuse central atrophy but intact retinal and outer retinal and RPE layers OD. ? ?OS, with diffuse macular atrophy and sclerotic choroid increased in size over the last 3 years from last visit ? ?  ? ? ?  ?  ? ?  ?ASSESSMENT/PLAN: ? ?OSA on CPAP ?Patient finally did follow advice and have herself checked for OSA to diminish the risk of macular hypoxia. ? ?She found to have severe sleep apnea and is on successful CPAP use although she gives her some permission not to like it ? ?Advanced nonexudative age-related macular degeneration of left eye with subfoveal involvement ?No signs of CNVM, significant progression since 2012 initial diagnosis. ? ? ? ?Early stage nonexudative age-related macular degeneration of right eye ?Central foveal atrophy has been noted and measured by OCT now for over 10 years.  No significant RPE atrophy and preserved acuity ? ?Likely with cataract surgery to  improve acuity ? ?Nuclear  sclerotic cataract of both eyes ?OU, with visually significant cataract although vision limited OS by central geographic atrophy of dry AMD. ? ?I agree with Dr. Sinda Du proceed with cataract surgery in the left eye first followed thereafter by the right eye in order to maximize this patient's potential visual acuity for the remainder of life  ? ?  ICD-10-CM   ?1. Advanced nonexudative age-related macular degeneration of left eye with subfoveal involvement  H35.3124 OCT, Retina - OU - Both Eyes  ?  ?2. OSA on CPAP  G47.33   ? Z99.89   ?  ?3. Early stage nonexudative age-related macular degeneration of right eye  H35.3111 OCT, Retina - OU - Both Eyes  ?  ?4. Nuclear sclerotic cataract of both eyes  H25.13   ?  ? ? ?1.  OU with dry AMD.  OS advanced.  No therapy available for the left eye at this time. ? ?2.  We will explained to the patient the right eye might in fact be eligible for treatment of dry AMD with new medications that may be available near the end of this calendar year.  We will discuss this once cataract surgery and its recovery is completed ? ?3. ? ?Ophthalmic Meds Ordered this visit:  ?No orders of the defined types were placed in this encounter. ? ? ?  ? ?Return in about 6 months (around 01/29/2022) for DILATE OU, OCT, COLOR FP. ? ?There are no Patient Instructions on file for this visit. ? ? ?Explained the diagnoses, plan, and follow up with the patient and they expressed understanding.  Patient expressed understanding of the importance of proper follow up care.  ? ?Gina Watson. Suni Jarnagin M.D. ?Diseases & Surgery of the Retina and Vitreous ?[facility] ?07/30/21 ? ? ? ? ?Abbreviations: ?M myopia (nearsighted); A astigmatism; H hyperopia (farsighted); P presbyopia; Mrx spectacle prescription;  CTL contact lenses; OD right eye; OS left eye; OU both eyes  XT exotropia; ET esotropia; PEK punctate epithelial keratitis; PEE punctate epithelial erosions; DES dry eye syndrome; MGD meibomian  gland dysfunction; ATs artificial tears; PFAT's preservative free artificial tears; NSC nuclear sclerotic cataract; PSC posterior subcapsular cataract; ERM epi-retinal membrane; PVD posterior vitreous deta

## 2022-01-29 ENCOUNTER — Encounter (INDEPENDENT_AMBULATORY_CARE_PROVIDER_SITE_OTHER): Payer: No Typology Code available for payment source | Admitting: Ophthalmology

## 2022-01-29 ENCOUNTER — Encounter (INDEPENDENT_AMBULATORY_CARE_PROVIDER_SITE_OTHER): Payer: Self-pay | Admitting: Ophthalmology

## 2022-01-29 ENCOUNTER — Ambulatory Visit (INDEPENDENT_AMBULATORY_CARE_PROVIDER_SITE_OTHER): Payer: No Typology Code available for payment source | Admitting: Ophthalmology

## 2022-01-29 DIAGNOSIS — H353111 Nonexudative age-related macular degeneration, right eye, early dry stage: Secondary | ICD-10-CM | POA: Diagnosis not present

## 2022-01-29 DIAGNOSIS — H2511 Age-related nuclear cataract, right eye: Secondary | ICD-10-CM

## 2022-01-29 DIAGNOSIS — H353124 Nonexudative age-related macular degeneration, left eye, advanced atrophic with subfoveal involvement: Secondary | ICD-10-CM

## 2022-01-29 DIAGNOSIS — G4733 Obstructive sleep apnea (adult) (pediatric): Secondary | ICD-10-CM | POA: Diagnosis not present

## 2022-01-29 NOTE — Assessment & Plan Note (Signed)
No real signs of drusenoid appearance in the right eye nor geographic atrophy.  However foveal atrophy suggestive of potential nutritional amblyopia affecting the retinal nerve structures and cell.  In the absence of definable glaucomatous fact, this suggest intrinsic retinal degeneration  For this reason I recommended use of vitamin B complex daily

## 2022-01-29 NOTE — Progress Notes (Signed)
01/29/2022     CHIEF COMPLAINT Patient presents for  Chief Complaint  Patient presents with   Macular Degeneration      HISTORY OF PRESENT ILLNESS: Gina Watson is a 61 y.o. female who presents to the clinic today for:   HPI   Age related macular degeneration of left eye geographic atrophy in the past.  Recent cataract surgery lens implantation now suffering with dry eye symptoms  OD with a past history of ongoing visual loss with retinal atrophy but no geographic vascular atrophy 6 mths dilate ou oct color fp Pt states her vision has been stable Pt denies any new floaters or FOL Pt states she had her cataract removed at the end of may in her left eye Last edited by Edmon Crape, MD on 01/29/2022  4:35 PM.      Referring physician: Sinda Du, MD [address] [place],  [place] [address]  HISTORICAL INFORMATION:   Selected notes from the MEDICAL RECORD NUMBER       CURRENT MEDICATIONS: No current outpatient medications on file. (Ophthalmic Drugs)   No current facility-administered medications for this visit. (Ophthalmic Drugs)   Current Outpatient Medications (Other)  Medication Sig   Bisacodyl (LAXATIVE PO) Take 1 tablet by mouth daily as needed (constipation).   BYSTOLIC 5 MG tablet Take 5 mg by mouth daily.   Calcium Carbonate (CALCIUM 600 PO) Take 600 mg by mouth daily.   escitalopram (LEXAPRO) 10 MG tablet Take 10 mg by mouth daily.   losartan (COZAAR) 25 MG tablet Take 25 mg by mouth daily.   Multiple Vitamins-Minerals (PRESERVISION/LUTEIN) CAPS Take 1 capsule by mouth 2 (two) times daily.   pravastatin (PRAVACHOL) 40 MG tablet Take 40 mg by mouth daily.   Probiotic Product (PROBIOTIC PO) Take 1 capsule by mouth daily.   No current facility-administered medications for this visit. (Other)      REVIEW OF SYSTEMS: ROS   Negative for: Constitutional, Gastrointestinal, Neurological, Skin, Genitourinary, Musculoskeletal, HENT, Endocrine, Cardiovascular,  Eyes, Respiratory, Psychiatric, Allergic/Imm, Heme/Lymph Last edited by Aleene Davidson, CMA on 01/29/2022  4:00 PM.       ALLERGIES Allergies  Allergen Reactions   Hydrocodone Itching and Other (See Comments)    "I lose my voice."   Penicillins Rash    Has patient had a PCN reaction causing immediate rash, facial/tongue/throat swelling, SOB or lightheadedness with hypotension: No Has patient had a PCN reaction causing severe rash involving mucus membranes or skin necrosis: No Has patient had a PCN reaction that required hospitalization Yes Has patient had a PCN reaction occurring within the last 10 years: No If all of the above answers are "NO", then may proceed with Cephalosporin use.    Sulfa Antibiotics Rash    PAST MEDICAL HISTORY Past Medical History:  Diagnosis Date   High cholesterol    Hypertension    Past Surgical History:  Procedure Laterality Date   CESAREAN SECTION     CHOLECYSTECTOMY     CYSTOSTOMY W/ BLADDER BIOPSY     TONSILLECTOMY      FAMILY HISTORY Family History  Problem Relation Age of Onset   Breast cancer Neg Hx     SOCIAL HISTORY Social History   Tobacco Use   Smoking status: Never  Substance Use Topics   Alcohol use: Yes    Alcohol/week: 1.0 standard drink of alcohol    Types: 1 Glasses of wine per week         OPHTHALMIC  EXAM:  Base Eye Exam     Visual Acuity (ETDRS)       Right Left   Dist cc 20/100 CF at 2'   Dist ph cc 20/60 +2     Correction: Glasses         Tonometry (Tonopen, 4:04 PM)       Right Left   Pressure 8 8         Pupils       Pupils   Right PERRL   Left PERRL         Visual Fields       Left Right    Full Full         Extraocular Movement       Right Left    Ortho Ortho    -- -- --  --  --  -- -- --   -- -- --  --  --  -- -- --           Neuro/Psych     Oriented x3: Yes         Dilation     Both eyes: 1.0% Mydriacyl, 2.5% Phenylephrine @ 4:01 PM            Slit Lamp and Fundus Exam     External Exam       Right Left   External Normal Normal         Slit Lamp Exam       Right Left   Lids/Lashes Normal Normal   Conjunctiva/Sclera White and quiet White and quiet   Cornea Clear Clear   Anterior Chamber Deep and quiet Deep and quiet   Iris Round and reactive Round and reactive   Lens 2.5+ Nuclear sclerosis Centered posterior chamber intraocular lens   Anterior Vitreous Normal Normal         Fundus Exam       Right Left   Posterior Vitreous Normal Normal   Disc Normal Normal   C/D Ratio 0.2 0.2   Macula Normal clinically minor atrophy Geographic atrophy, roughly 4-5 disc areas in size   Vessels Normal Normal   Periphery Normal Normal            IMAGING AND PROCEDURES  Imaging and Procedures for 01/29/22  OCT, Retina - OU - Both Eyes       Right Eye Quality was good. Scan locations included subfoveal. Central Foveal Thickness: 166. Progression has been stable. Findings include abnormal foveal contour.   Left Eye Quality was borderline. Scan locations included subfoveal. Central Foveal Thickness: 274. Progression has been stable. Findings include abnormal foveal contour.   Notes Diffuse central atrophy but intact retinal and outer retinal and RPE layers OD.  OS, with diffuse macular atrophy and sclerotic choroid increased in size over the last 3 years from last visit     Color Fundus Photography Optos - OU - Both Eyes       Right Eye Progression has no prior data. Disc findings include normal observations. Macula : normal observations. Vessels : normal observations. Periphery : normal observations.   Left Eye Progression has no prior data. Disc findings include normal observations. Macula : geographic atrophy. Vessels : normal observations. Periphery : normal observations.   Notes Deep with diffuse foveal atrophy yet with intact outer retinal layers thus good acuity.  Progression ofFoveal thinning  however, with no signs of outer retinal geographic atrophy.  This suggest possible nutritional amblyopia for which I recommended consideration of  using vitamin B complex to supplement vitamins that she may take daily             ASSESSMENT/PLAN:  Nuclear sclerotic cataract of right eye Moderate OD, follow-up Dr. Valetta Close as scheduled  Early stage nonexudative age-related macular degeneration of right eye No real signs of drusenoid appearance in the right eye nor geographic atrophy.  However foveal atrophy suggestive of potential nutritional amblyopia affecting the retinal nerve structures and cell.  In the absence of definable glaucomatous fact, this suggest intrinsic retinal degeneration  For this reason I recommended use of vitamin B complex daily  Advanced nonexudative age-related macular degeneration of left eye with subfoveal involvement Large geographic atrophy not amenable to therapy with Syfovre     ICD-10-CM   1. Advanced nonexudative age-related macular degeneration of left eye with subfoveal involvement  H35.3124 OCT, Retina - OU - Both Eyes    Color Fundus Photography Optos - OU - Both Eyes    2. OSA on CPAP  G47.33     3. Nuclear sclerotic cataract of right eye  H25.11     4. Early stage nonexudative age-related macular degeneration of right eye  H35.3111       1.  OD patient with foveal atrophy diffuse retinal atrophy, will treat empirically with vitamin B complex.  Does not qualify for use of AREDS 2 vitamins, as there is no sign of intermediate ARMD  2.  Continue with treatment of OSA with CPAP  3.  Proceed with cataract surgery right eye at any time to maximize cornea acuity  Ophthalmic Meds Ordered this visit:  No orders of the defined types were placed in this encounter.      Return in about 6 months (around 07/31/2022) for DILATE OU, OCT.  There are no Patient Instructions on file for this visit.   Explained the diagnoses, plan, and follow up with  the patient and they expressed understanding.  Patient expressed understanding of the importance of proper follow up care.   Clent Demark Aleigha Gilani M.D. Diseases & Surgery of the Retina and Vitreous [facility] 01/29/22     Abbreviations: M myopia (nearsighted); A astigmatism; H hyperopia (farsighted); P presbyopia; Mrx spectacle prescription;  CTL contact lenses; OD right eye; OS left eye; OU both eyes  XT exotropia; ET esotropia; PEK punctate epithelial keratitis; PEE punctate epithelial erosions; DES dry eye syndrome; MGD meibomian gland dysfunction; ATs artificial tears; PFAT's preservative free artificial tears; [place] nuclear sclerotic cataract; PSC posterior subcapsular cataract; ERM epi-retinal membrane; PVD posterior vitreous detachment; RD retinal detachment; DM diabetes mellitus; DR diabetic retinopathy; NPDR non-proliferative diabetic retinopathy; PDR proliferative diabetic retinopathy; CSME clinically significant macular edema; DME diabetic macular edema; dbh dot blot hemorrhages; CWS cotton wool spot; POAG primary open angle glaucoma; C/D cup-to-disc ratio; HVF humphrey visual field; GVF goldmann visual field; OCT optical coherence tomography; IOP intraocular pressure; BRVO Branch retinal vein occlusion; CRVO central retinal vein occlusion; CRAO central retinal artery occlusion; BRAO branch retinal artery occlusion; RT retinal tear; SB scleral buckle; PPV pars plana vitrectomy; VH Vitreous hemorrhage; PRP panretinal laser photocoagulation; IVK intravitreal kenalog; VMT vitreomacular traction; MH Macular hole;  NVD neovascularization of the disc; NVE neovascularization elsewhere; AREDS age related eye disease study; ARMD age related macular degeneration; POAG primary open angle glaucoma; EBMD epithelial/anterior basement membrane dystrophy; ACIOL anterior chamber intraocular lens; IOL intraocular lens; PCIOL posterior chamber intraocular lens; Phaco/IOL phacoemulsification with  intraocular lens placement; [place] photorefractive keratectomy; LASIK laser assisted in situ keratomileusis;  HTN hypertension; DM diabetes mellitus; COPD chronic obstructive pulmonary disease

## 2022-01-29 NOTE — Assessment & Plan Note (Signed)
Large geographic atrophy not amenable to therapy with Syfovre

## 2022-01-29 NOTE — Assessment & Plan Note (Signed)
Moderate OD, follow-up Dr. Valetta Close as scheduled

## 2022-02-07 ENCOUNTER — Encounter (INDEPENDENT_AMBULATORY_CARE_PROVIDER_SITE_OTHER): Payer: No Typology Code available for payment source | Admitting: Ophthalmology

## 2022-05-22 ENCOUNTER — Ambulatory Visit: Payer: No Typology Code available for payment source | Admitting: Adult Health

## 2022-06-04 ENCOUNTER — Encounter: Payer: Self-pay | Admitting: Adult Health

## 2022-06-04 ENCOUNTER — Ambulatory Visit: Payer: No Typology Code available for payment source | Admitting: Adult Health

## 2022-06-04 VITALS — BP 110/67 | HR 79 | Ht 62.0 in | Wt 170.0 lb

## 2022-06-04 DIAGNOSIS — F331 Major depressive disorder, recurrent, moderate: Secondary | ICD-10-CM | POA: Diagnosis not present

## 2022-06-04 DIAGNOSIS — F411 Generalized anxiety disorder: Secondary | ICD-10-CM | POA: Diagnosis not present

## 2022-06-04 MED ORDER — ALPRAZOLAM 0.25 MG PO TABS: 0.2500 mg | ORAL_TABLET | Freq: Every evening | ORAL | 0 refills | Status: DC | PRN

## 2022-06-04 MED ORDER — SERTRALINE HCL 50 MG PO TABS: 50.0000 mg | ORAL_TABLET | Freq: Every day | ORAL | 2 refills | Status: DC

## 2022-06-04 NOTE — Progress Notes (Signed)
[facility] MD/PA/NP Initial Note  06/04/2022 12:37 PM Gina Watson  MRN:  [id number]  Chief Complaint:   HPI:   Patient seen today for initial psychiatric evaluation.   Previously seen by PCP for medication management.  Describes mood today as "not too good". Pleasant. Tearful throughout interview. Mood symptoms - reports increased depression, anxiety, and irritability. Stating "I have always struggled with mood symptoms - since childhood". Reports worry, rumination, and over thinking - "always". Mood is variable - mostly lower. Symptoms have worsened since discontinuing Lexapro 10mg  daily 7 months ago. Felt like she did well with the Lexapro, but did experience  - weight gain, fatigue, and low energy. Now feels more energetic than she has in years, but her mood has declined. Willing to consider other options to help manage mood symptoms. Stable interest and motivation. Taking medications as prescribed.  Energy levels lower. Active, does not have a regular exercise routine.   Enjoys some usual interests and activities. Married. Lives with husband. Has 2 adult children - both local - and 2 grandchildren. Mother local. Spending time with family. Appetite adequate. Weight stable - 170 pounds. Sleeps well most nights. Averages 8 hours. Using a CPAP machine. Focus and concentration stable. Completing tasks. Managing aspects of household. Works full time remotely - [employer] - since 1985. Denies SI or HI.  Denies AH or VH. Denies self harm. Denies substance use. Consumes alcohol socially.  Previous medication trials:  Lexapro, Serzone, Zoloft   Visit Diagnosis:    ICD-10-CM   1. Major depressive disorder, recurrent episode, moderate (HCC)  F33.1     2. Generalized anxiety disorder  F41.1 ALPRAZolam (XANAX) 0.25 MG tablet    sertraline (ZOLOFT) 50 MG tablet      Past Psychiatric History: Denies psychiatric hospitalization.   Past Medical History:  Past Medical History:   Diagnosis Date   High cholesterol    Hypertension     Past Surgical History:  Procedure Laterality Date   CESAREAN SECTION     CHOLECYSTECTOMY     CYSTOSTOMY W/ BLADDER BIOPSY     TONSILLECTOMY      Family Psychiatric History: Denies any family history of mental illness.   Family History:  Family History  Problem Relation Age of Onset   Breast cancer Neg Hx     Social History:  Social History   Socioeconomic History   Marital status: Married    Spouse name: Not on file   Number of children: Not on file   Years of education: Not on file   Highest education level: Not on file  Occupational History   Not on file  Tobacco Use   Smoking status: Never   Smokeless tobacco: Never  Substance and Sexual Activity   Alcohol use: Yes    Alcohol/week: 1.0 standard drink of alcohol    Types: 1 Glasses of wine per week   Drug use: Not on file   Sexual activity: Not on file  Other Topics Concern   Not on file  Social History Narrative   Not on file   Social Determinants of Health   Financial Resource Strain: Not on file  Food Insecurity: Not on file  Transportation Needs: Not on file  Physical Activity: Not on file  Stress: Not on file  Social Connections: Not on file    Allergies:  Allergies  Allergen Reactions   Hydrocodone Itching and Other (See Comments)    "I lose my voice."   Penicillins Rash  Has patient had a PCN reaction causing immediate rash, facial/tongue/throat swelling, SOB or lightheadedness with hypotension: No Has patient had a PCN reaction causing severe rash involving mucus membranes or skin necrosis: No Has patient had a PCN reaction that required hospitalization Yes Has patient had a PCN reaction occurring within the last 10 years: No If all of the above answers are "NO", then may proceed with Cephalosporin use.    Sulfa Antibiotics Rash    Metabolic Disorder Labs: No results found for: "HGBA1C", "MPG" No results found for:  "PROLACTIN" No results found for: "CHOL", "TRIG", "HDL", "CHOLHDL", "VLDL", "LDLCALC" No results found for: "TSH"  Therapeutic Level Labs: No results found for: "LITHIUM" No results found for: "VALPROATE" No results found for: "CBMZ"  Current Medications: Current Outpatient Medications  Medication Sig Dispense Refill   sertraline (ZOLOFT) 50 MG tablet Take 1 tablet (50 mg total) by mouth daily. 30 tablet 2   ALPRAZolam (XANAX) 0.25 MG tablet Take 1 tablet (0.25 mg total) by mouth at bedtime as needed for sleep. 30 tablet 0   BYSTOLIC 5 MG tablet Take 5 mg by mouth daily.     Calcium Carbonate (CALCIUM 600 PO) Take 600 mg by mouth daily.     losartan (COZAAR) 25 MG tablet Take 25 mg by mouth daily.     Multiple Vitamins-Minerals (PRESERVISION/LUTEIN) CAPS Take 1 capsule by mouth 2 (two) times daily.     pravastatin (PRAVACHOL) 40 MG tablet Take 40 mg by mouth daily.     Probiotic Product (PROBIOTIC PO) Take 1 capsule by mouth daily.     No current facility-administered medications for this visit.    Medication Side Effects: none  Orders placed this visit:  No orders of the defined types were placed in this encounter.   Psychiatric Specialty Exam:  Review of Systems  Musculoskeletal:  Negative for gait problem.  Neurological:  Negative for tremors.  Psychiatric/Behavioral:         Please refer to HPI    Blood pressure 110/67, pulse 79, height 5\' 2"  (1.575 m), weight 170 lb (77.1 kg).Body mass index is 31.09 kg/m.  General Appearance: Casual and Neat  Eye Contact:  Good  Speech:  Clear and Coherent and Normal Rate  Volume:  Normal  Mood:  Anxious and Depressed  Affect:  Appropriate and Congruent  Thought Process:  Coherent and Descriptions of Associations: Intact  Orientation:  Full (Time, Place, and Person)  Thought Content: Logical   Suicidal Thoughts:  No  Homicidal Thoughts:  No  Memory:  WNL  Judgement:  Good  Insight:  Good  Psychomotor Activity:  Normal   Concentration:  Concentration: Good and Attention Span: Good  Recall:  Good  Fund of Knowledge: Good  Language: Good  Assets:  Communication Skills Desire for Improvement Financial Resources/Insurance Housing Intimacy Leisure Time Physical Health Resilience Social Support Talents/Skills Transportation Vocational/Educational  ADL's:  Intact  Cognition: WNL  Prognosis:  Good   Screenings: MDQ  Receiving Psychotherapy: No   Treatment Plan/Recommendations:   Plan:  PDMP reviewed  Add Zoloft 50mg  daily - take 1/2 tablet daily x 7 days, then increase to one tablet daily. Add Xanax 0.25mg  daily as needed for anxiety.   Follow up with PCP for labs - SH, Vit D, and B12.  RTC 4 weeks  Patient advised to contact office with any questions, adverse effects, or acute worsening in signs and symptoms.  Discussed potential benefits, risk, and side effects of benzodiazepines to include potential risk  of tolerance and dependence, as well as possible drowsiness.  Advised patient not to drive if experiencing drowsiness and to take lowest possible effective dose to minimize risk of dependence and tolerance.     Aloha Gell, NP

## 2022-06-26 ENCOUNTER — Other Ambulatory Visit: Payer: Self-pay | Admitting: Adult Health

## 2022-06-26 DIAGNOSIS — F411 Generalized anxiety disorder: Secondary | ICD-10-CM

## 2022-07-02 ENCOUNTER — Encounter: Payer: Self-pay | Admitting: Adult Health

## 2022-07-02 ENCOUNTER — Ambulatory Visit: Payer: No Typology Code available for payment source | Admitting: Adult Health

## 2022-07-02 DIAGNOSIS — F411 Generalized anxiety disorder: Secondary | ICD-10-CM | POA: Diagnosis not present

## 2022-07-02 DIAGNOSIS — F331 Major depressive disorder, recurrent, moderate: Secondary | ICD-10-CM

## 2022-07-02 MED ORDER — SERTRALINE HCL 100 MG PO TABS: 50.0000 mg | ORAL_TABLET | Freq: Every day | ORAL | 2 refills | Status: DC

## 2022-07-02 MED ORDER — ALPRAZOLAM 0.25 MG PO TABS: 0.2500 mg | ORAL_TABLET | Freq: Every evening | ORAL | 2 refills | Status: DC | PRN

## 2022-07-02 NOTE — Progress Notes (Signed)
[name] 1960/11/08 62 y.o.  Subjective:   Patient ID:  Gina Watson is a 62 y.o. (DOB 1961-03-30) female.  Chief Complaint: No chief complaint on file.   HPI [name] presents to the office today for follow-up of MDD and GAD.  Describes mood today as "a little better". Pleasant. Decreased tearfulness. Mood symptoms - reports  decreased depression, anxiety, and irritability. Has noticed "some" improvement with the addition of Zoloft. Stating "I'm not having as deep of a sadness". Reports worry, rumination, and over thinking - "largely at night". Mood is variable - still lower. Feels like the addition of Zoloft has been helpful and would like to increase the dose. Improved Stable and motivation. Taking medications as prescribed.  Energy levels lower. Active, does not have a regular exercise routine.   Enjoys some usual interests and activities. Married. Lives with husband. Has 2 adult children - both local - and 2 grandchildren. Mother local. Spending time with family. Appetite adequate. Weight stable - 169 pounds. Sleeps well most nights. Averages 8 hours. Using a CPAP machine. Focus and concentration stable. Completing tasks. Managing aspects of household. Works full time remotely - [employer] - since 1985. Denies SI or HI.  Denies AH or VH. Denies self harm. Denies substance use. Consumes alcohol socially.  Previous medication trials:  Lexapro, Serzone, Zoloft  Review of Systems:  Review of Systems  Musculoskeletal:  Negative for gait problem.  Neurological:  Negative for tremors.  Psychiatric/Behavioral:         Please refer to HPI    Medications: I have reviewed the patient's current medications.  Current Outpatient Medications  Medication Sig Dispense Refill   ALPRAZolam (XANAX) 0.25 MG tablet Take 1 tablet (0.25 mg total) by mouth at bedtime as needed for sleep. 30 tablet 2   BYSTOLIC 5 MG tablet Take 5 mg by mouth daily.     Calcium  Carbonate (CALCIUM 600 PO) Take 600 mg by mouth daily.     losartan (COZAAR) 25 MG tablet Take 25 mg by mouth daily.     Multiple Vitamins-Minerals (PRESERVISION/LUTEIN) CAPS Take 1 capsule by mouth 2 (two) times daily.     pravastatin (PRAVACHOL) 40 MG tablet Take 40 mg by mouth daily.     Probiotic Product (PROBIOTIC PO) Take 1 capsule by mouth daily.     sertraline (ZOLOFT) 100 MG tablet Take 0.5 tablets (50 mg total) by mouth daily. 30 tablet 2   No current facility-administered medications for this visit.    Medication Side Effects: None  Allergies:  Allergies  Allergen Reactions   Hydrocodone Itching and Other (See Comments)    "I lose my voice."   Penicillins Rash    Has patient had a PCN reaction causing immediate rash, facial/tongue/throat swelling, SOB or lightheadedness with hypotension: No Has patient had a PCN reaction causing severe rash involving mucus membranes or skin necrosis: No Has patient had a PCN reaction that required hospitalization Yes Has patient had a PCN reaction occurring within the last 10 years: No If all of the above answers are "NO", then may proceed with Cephalosporin use.    Sulfa Antibiotics Rash    Past Medical History:  Diagnosis Date   High cholesterol    Hypertension     Past Medical History, Surgical history, Social history, and Family history were reviewed and updated as appropriate.   Please see review of systems for further details on the patient's review from today.   Objective:  Physical Exam:  There were no vitals taken for this visit.  Physical Exam Constitutional:      General: She is not in acute distress. Musculoskeletal:        General: No deformity.  Neurological:     Mental Status: She is alert and oriented to person, place, and time.     Coordination: Coordination normal.  Psychiatric:        Attention and Perception: Attention and perception normal. She does not perceive auditory or visual hallucinations.         Mood and Affect: Mood normal. Mood is not anxious or depressed. Affect is not labile, blunt, angry or inappropriate.        Speech: Speech normal.        Behavior: Behavior normal.        Thought Content: Thought content normal. Thought content is not paranoid or delusional. Thought content does not include homicidal or suicidal ideation. Thought content does not include homicidal or suicidal plan.        Cognition and Memory: Cognition and memory normal.        Judgment: Judgment normal.     Comments: Insight intact     Lab Review:     Component Value Date/Time   NA 137 10/01/2015 1803   K 4.1 10/01/2015 1803   CL 103 10/01/2015 1803   CO2 24 10/01/2015 1803   GLUCOSE 99 10/01/2015 1803   BUN 19 10/01/2015 1803   CREATININE 0.81 10/01/2015 1803   CALCIUM 9.3 10/01/2015 1803   PROT 7.4 10/01/2015 1803   ALBUMIN 4.2 10/01/2015 1803   AST 29 10/01/2015 1803   ALT 21 10/01/2015 1803   ALKPHOS 62 10/01/2015 1803   BILITOT 0.9 10/01/2015 1803   GFRNONAA >60 10/01/2015 1803   GFRAA >60 10/01/2015 1803       Component Value Date/Time   WBC 16.7 (H) 10/01/2015 1803   RBC 4.71 10/01/2015 1803   HGB 13.9 10/01/2015 1803   HCT 39.2 10/01/2015 1803   PLT 329 10/01/2015 1803   MCV 83.2 10/01/2015 1803   MCH 29.5 10/01/2015 1803   MCHC 35.5 10/01/2015 1803   RDW 12.7 10/01/2015 1803   LYMPHSABS 1.7 10/01/2015 1803   MONOABS 0.7 10/01/2015 1803   EOSABS 0.0 10/01/2015 1803   BASOSABS 0.0 10/01/2015 1803    No results found for: "POCLITH", "LITHIUM"   No results found for: "PHENYTOIN", "PHENOBARB", "VALPROATE", "CBMZ"   .res Assessment: Plan:    Plan:  PDMP reviewed  Increase Zoloft '50mg'$  to '100mg'$  daily. Xanax 0.'25mg'$  daily as needed for anxiety.   Follow up with PCP for labs - TSH, Vit D, and B12.  RTC 4 weeks  Patient advised to contact office with any questions, adverse effects, or acute worsening in signs and symptoms.  Discussed potential benefits, risk,  and side effects of benzodiazepines to include potential risk of tolerance and dependence, as well as possible drowsiness.  Advised patient not to drive if experiencing drowsiness and to take lowest possible effective dose to minimize risk of dependence and tolerance.    Diagnoses and all orders for this visit:  Major depressive disorder, recurrent episode, moderate (HCC)  Generalized anxiety disorder -     sertraline (ZOLOFT) 100 MG tablet; Take 0.5 tablets (50 mg total) by mouth daily. -     ALPRAZolam (XANAX) 0.25 MG tablet; Take 1 tablet (0.25 mg total) by mouth at bedtime as needed for sleep.     Please see After Visit  Summary for patient specific instructions.  Future Appointments  Date Time Provider [place]  08/01/2022  3:45 PM Rankin, Clent Demark, MD RDE-RDE None    No orders of the defined types were placed in this encounter.   -------------------------------

## 2022-07-30 ENCOUNTER — Ambulatory Visit (INDEPENDENT_AMBULATORY_CARE_PROVIDER_SITE_OTHER): Payer: Self-pay | Admitting: Adult Health

## 2022-07-30 DIAGNOSIS — Z0389 Encounter for observation for other suspected diseases and conditions ruled out: Secondary | ICD-10-CM

## 2022-07-30 NOTE — Progress Notes (Signed)
Patient no show appointment. ? ?

## 2022-08-01 ENCOUNTER — Encounter (INDEPENDENT_AMBULATORY_CARE_PROVIDER_SITE_OTHER): Payer: No Typology Code available for payment source | Admitting: Ophthalmology

## 2022-08-21 ENCOUNTER — Ambulatory Visit: Payer: No Typology Code available for payment source | Admitting: Adult Health

## 2022-08-25 ENCOUNTER — Other Ambulatory Visit: Payer: Self-pay | Admitting: Adult Health

## 2022-08-25 DIAGNOSIS — F411 Generalized anxiety disorder: Secondary | ICD-10-CM

## 2022-08-25 NOTE — Telephone Encounter (Signed)
LV changed to 100 mg, verify dose.

## 2022-09-05 ENCOUNTER — Ambulatory Visit: Payer: No Typology Code available for payment source | Admitting: Adult Health

## 2022-09-05 ENCOUNTER — Encounter: Payer: Self-pay | Admitting: Adult Health

## 2022-09-05 DIAGNOSIS — F411 Generalized anxiety disorder: Secondary | ICD-10-CM

## 2022-09-05 DIAGNOSIS — F331 Major depressive disorder, recurrent, moderate: Secondary | ICD-10-CM | POA: Diagnosis not present

## 2022-09-05 MED ORDER — SERTRALINE HCL 100 MG PO TABS: 100.0000 mg | ORAL_TABLET | Freq: Every day | ORAL | 2 refills | Status: DC

## 2022-09-05 MED ORDER — BUPROPION HCL ER (XL) 150 MG PO TB24: 150.0000 mg | ORAL_TABLET | Freq: Every day | ORAL | 2 refills | Status: DC

## 2022-09-05 NOTE — Progress Notes (Signed)
Gina Watson [id number] [birth year] 62 y.o.  Subjective:   Patient ID:  Gina Watson is a 62 y.o. (DOB 01-27-1961) female.  Chief Complaint: No chief complaint on file.   HPI Gina Watson presents to the office today for follow-up of MDD and GAD.  Describes mood today as "a little better". Pleasant. Decreased tearfulness. Mood symptoms - reports  decreased depression, anxiety, and irritability. Reports decreased worry, rumination, and over thinking - "not waking up and thinking about things". Mood is low - but "better". Stating "I'm still not where I need to be, but not in the hole I was in". Has tolerated the increase in Zoloft, but feels lethargic. Stable and motivation. Taking medications as prescribed. Energy levels lower. Active, does not have a regular exercise routine.   Enjoys some usual interests and activities. Married. Lives with husband. Has 2 adult children - both local - and 2 grandchildren. Mother local. Spending time with family. Appetite adequate. Weight stable - 169 pounds. Sleeps well most nights. Averages 8 hours. Using a CPAP machine. Focus and concentration difficulties - "bouncing a lot". Completing tasks. Managing aspects of household. Works full time remotely - [employer] - since 1985. Denies SI or HI.  Denies AH or VH. Denies self harm. Denies substance use. Consumes alcohol socially.  Previous medication trials:  Lexapro, Serzone, Zoloft  Review of Systems:  Review of Systems  Musculoskeletal:  Negative for gait problem.  Neurological:  Negative for tremors.  Psychiatric/Behavioral:         Please refer to HPI    Medications: I have reviewed the patient's current medications.  Current Outpatient Medications  Medication Sig Dispense Refill   ALPRAZolam (XANAX) 0.25 MG tablet Take 1 tablet (0.25 mg total) by mouth at bedtime as needed for sleep. 30 tablet 2   BYSTOLIC 5 MG tablet Take 5 mg by mouth daily.     Calcium Carbonate (CALCIUM  600 PO) Take 600 mg by mouth daily.     losartan (COZAAR) 25 MG tablet Take 25 mg by mouth daily.     Multiple Vitamins-Minerals (PRESERVISION/LUTEIN) CAPS Take 1 capsule by mouth 2 (two) times daily.     pravastatin (PRAVACHOL) 40 MG tablet Take 40 mg by mouth daily.     Probiotic Product (PROBIOTIC PO) Take 1 capsule by mouth daily.     sertraline (ZOLOFT) 100 MG tablet Take 1 tablet (100 mg total) by mouth daily. 30 tablet 0   No current facility-administered medications for this visit.    Medication Side Effects: None  Allergies:  Allergies  Allergen Reactions   Hydrocodone Itching and Other (See Comments)    "I lose my voice."   Penicillins Rash    Has patient had a PCN reaction causing immediate rash, facial/tongue/throat swelling, SOB or lightheadedness with hypotension: No Has patient had a PCN reaction causing severe rash involving mucus membranes or skin necrosis: No Has patient had a PCN reaction that required hospitalization Yes Has patient had a PCN reaction occurring within the last 10 years: No If all of the above answers are "NO", then may proceed with Cephalosporin use.    Sulfa Antibiotics Rash    Past Medical History:  Diagnosis Date   High cholesterol    Hypertension     Past Medical History, Surgical history, Social history, and Family history were reviewed and updated as appropriate.   Please see review of systems for further details on the patient's review from today.   Objective:  Physical Exam:  There were no vitals taken for this visit.  Physical Exam Constitutional:      General: She is not in acute distress. Musculoskeletal:        General: No deformity.  Neurological:     Mental Status: She is alert and oriented to person, place, and time.     Coordination: Coordination normal.  Psychiatric:        Attention and Perception: Attention and perception normal. She does not perceive auditory or visual hallucinations.        Mood and Affect:  Mood normal. Mood is not anxious or depressed. Affect is not labile, blunt, angry or inappropriate.        Speech: Speech normal.        Behavior: Behavior normal.        Thought Content: Thought content normal. Thought content is not paranoid or delusional. Thought content does not include homicidal or suicidal ideation. Thought content does not include homicidal or suicidal plan.        Cognition and Memory: Cognition and memory normal.        Judgment: Judgment normal.     Comments: Insight intact     Lab Review:     Component Value Date/Time   NA 137 10/01/2015 1803   K 4.1 10/01/2015 1803   CL 103 10/01/2015 1803   CO2 24 10/01/2015 1803   GLUCOSE 99 10/01/2015 1803   BUN 19 10/01/2015 1803   CREATININE 0.81 10/01/2015 1803   CALCIUM 9.3 10/01/2015 1803   PROT 7.4 10/01/2015 1803   ALBUMIN 4.2 10/01/2015 1803   AST 29 10/01/2015 1803   ALT 21 10/01/2015 1803   ALKPHOS 62 10/01/2015 1803   BILITOT 0.9 10/01/2015 1803   GFRNONAA >60 10/01/2015 1803   GFRAA >60 10/01/2015 1803       Component Value Date/Time   WBC 16.7 (H) 10/01/2015 1803   RBC 4.71 10/01/2015 1803   HGB 13.9 10/01/2015 1803   HCT 39.2 10/01/2015 1803   PLT 329 10/01/2015 1803   MCV 83.2 10/01/2015 1803   MCH 29.5 10/01/2015 1803   MCHC 35.5 10/01/2015 1803   RDW 12.7 10/01/2015 1803   LYMPHSABS 1.7 10/01/2015 1803   MONOABS 0.7 10/01/2015 1803   EOSABS 0.0 10/01/2015 1803   BASOSABS 0.0 10/01/2015 1803    No results found for: "POCLITH", "LITHIUM"   No results found for: "PHENYTOIN", "PHENOBARB", "VALPROATE", "CBMZ"   .res Assessment: Plan:    Plan:  PDMP reviewed  Add Wellbutrin XL 150mg  every morning for depression - denies seizure history.   Zoloft 100mg  daily. Xanax 0.25mg  daily as needed for anxiety.   RTC 4 weeks  Patient advised to contact office with any questions, adverse effects, or acute worsening in signs and symptoms.  Discussed potential benefits, risk, and side  effects of benzodiazepines to include potential risk of tolerance and dependence, as well as possible drowsiness. Advised patient not to drive if experiencing drowsiness and to take lowest possible effective dose to minimize risk of dependence and tolerance.    Time spent with patient was 15 minutes. Greater than 50% of face to face time with patient was spent on counseling and coordination of care.     There are no diagnoses linked to this encounter.   Please see After Visit Summary for patient specific instructions.  No future appointments.  No orders of the defined types were placed in this encounter.   -------------------------------

## 2022-09-18 IMAGING — MG MM DIGITAL SCREENING BILAT W/ TOMO AND CAD
8 series · 8 of 24 positions shown · non-contrast
Comparison: Previous exam(s).

CLINICAL DATA: Screening.

EXAM:
DIGITAL SCREENING BILATERAL MAMMOGRAM WITH TOMOSYNTHESIS AND CAD
TECHNIQUE: Bilateral screening digital craniocaudal and mediolateral oblique
mammograms were obtained. Bilateral screening digital breast
tomosynthesis was performed. The images were evaluated with
computer-aided detection.

[L MLO synth-2D]
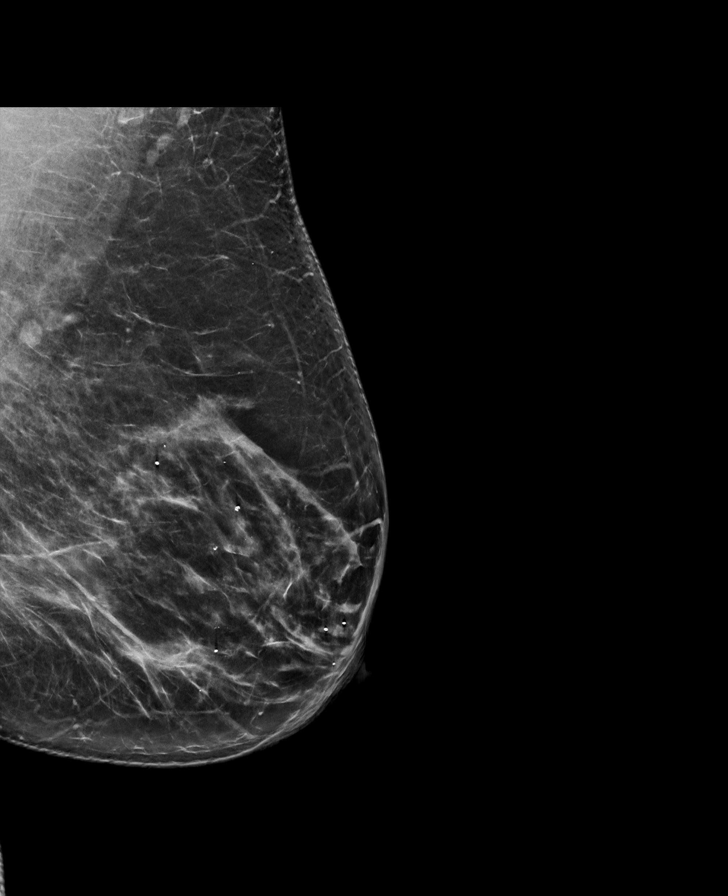

[L CC synth-2D]
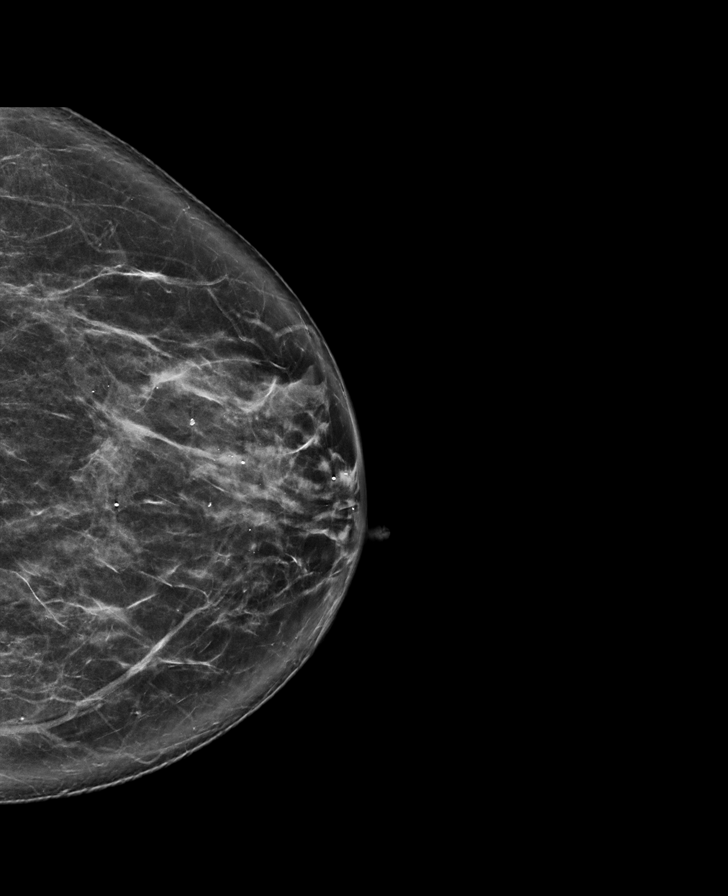

[R MLO synth-2D]
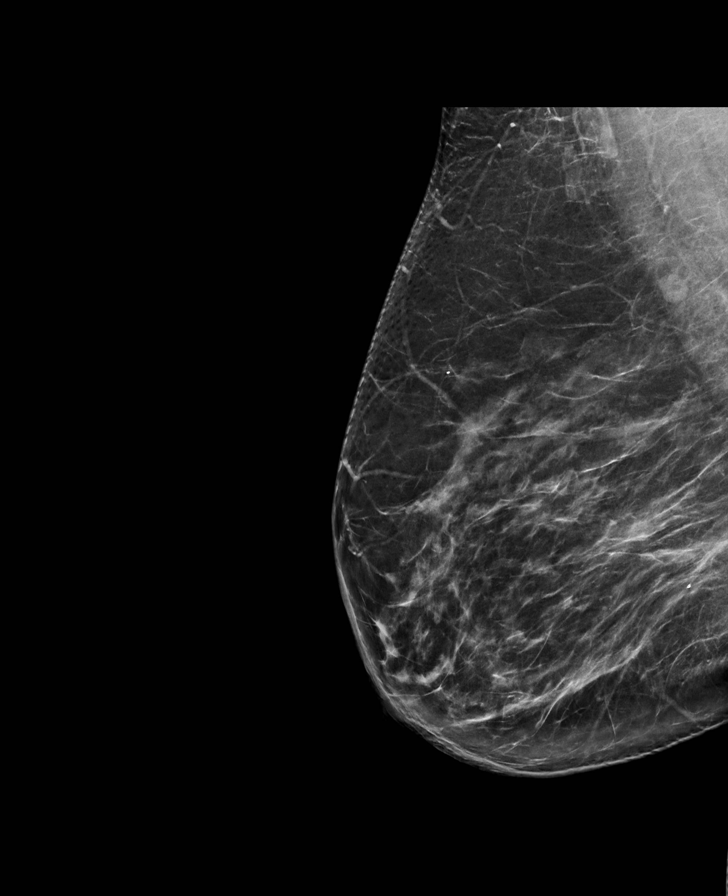

[R CC synth-2D]
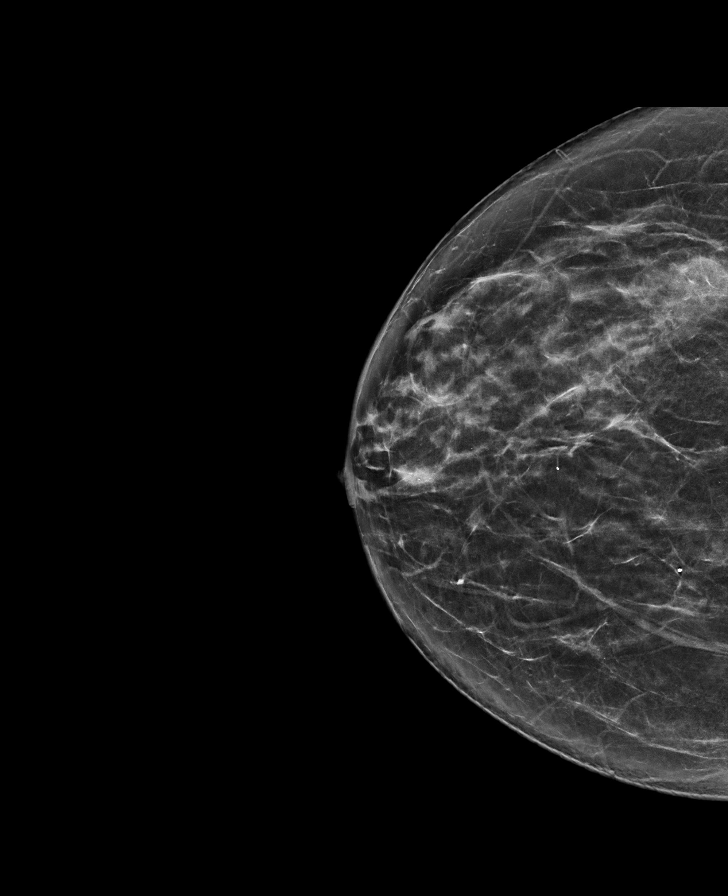

[R CC tomo · tomo slice 35/70.0]
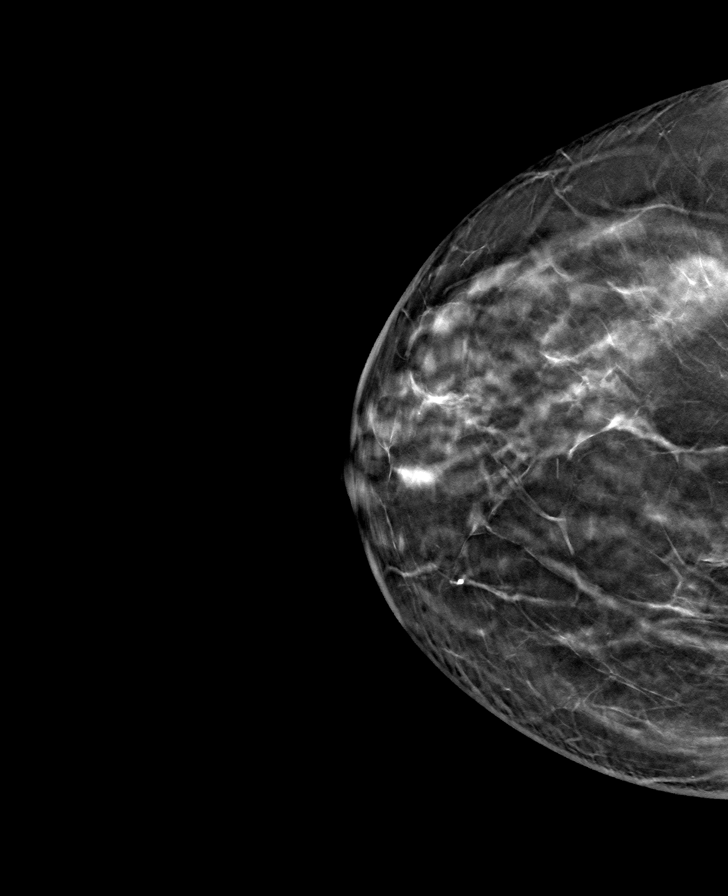

[L CC tomo · tomo slice 41/80.0]
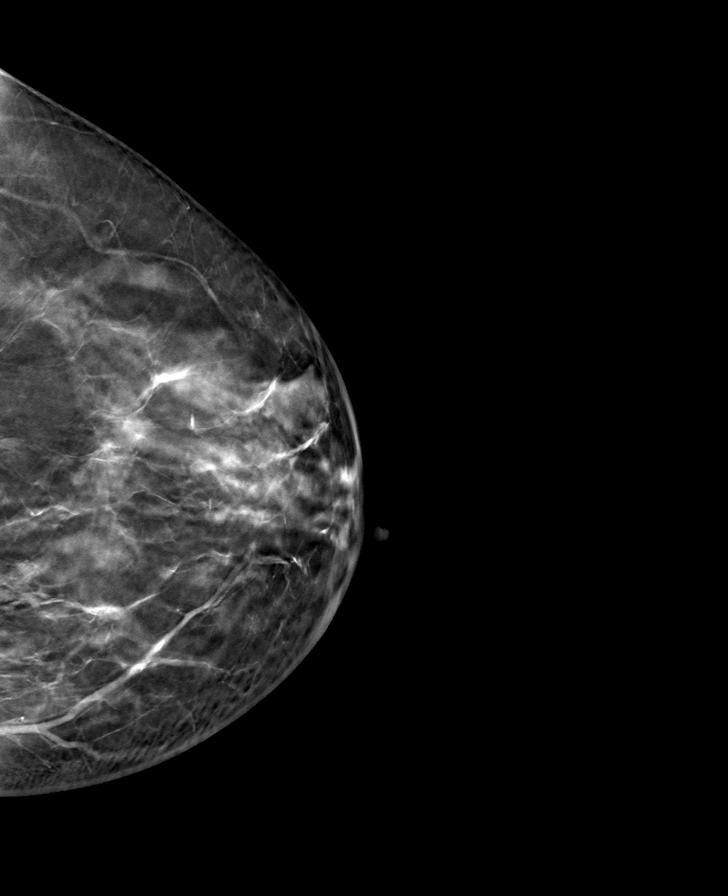

[L MLO tomo · tomo slice 43/84.0]
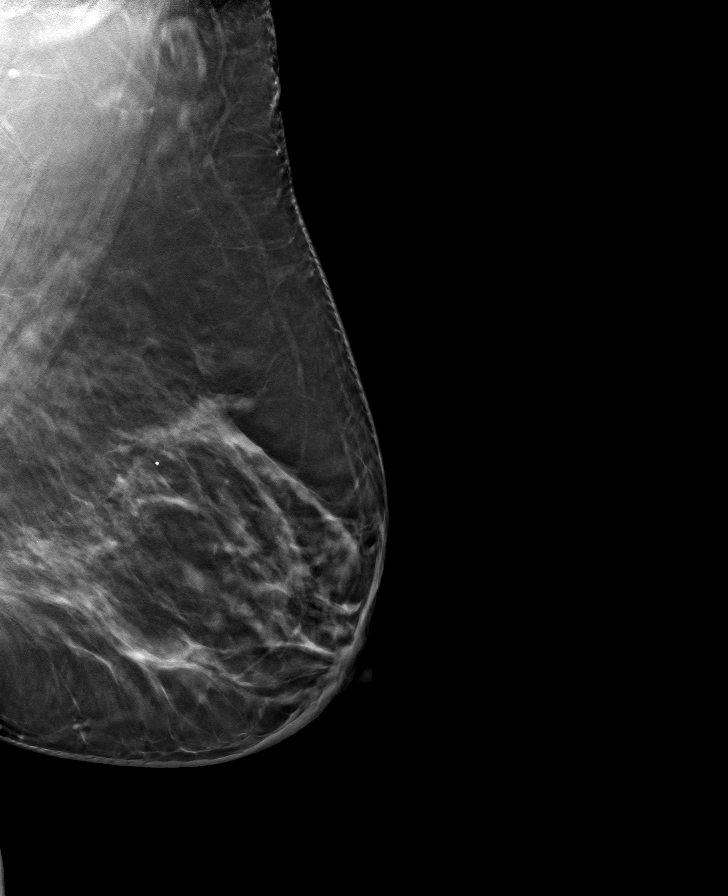

[R MLO tomo · tomo slice 43/86.0]
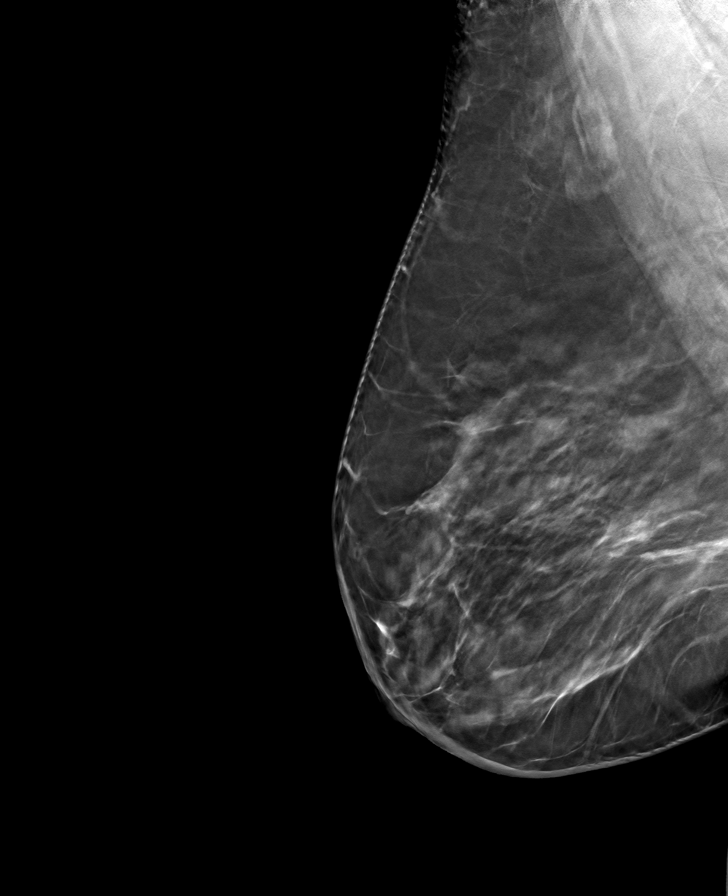

[8 of 24 positions shown; findings below may reference images not displayed]

ACR Breast Density Category b: There are scattered areas of
fibroglandular density.
FINDINGS: There are no findings suspicious for malignancy.
IMPRESSION: No mammographic evidence of malignancy. A result letter of this
screening mammogram will be mailed directly to the patient.

RECOMMENDATION:
Screening mammogram in one year. (Code:51-O-LD2)

BI-RADS CATEGORY  1: Negative.

## 2022-09-28 ENCOUNTER — Other Ambulatory Visit: Payer: Self-pay | Admitting: Adult Health

## 2022-09-28 DIAGNOSIS — F331 Major depressive disorder, recurrent, moderate: Secondary | ICD-10-CM

## 2022-09-28 DIAGNOSIS — F411 Generalized anxiety disorder: Secondary | ICD-10-CM

## 2022-09-29 NOTE — Telephone Encounter (Signed)
Appt 6/6

## 2022-10-03 ENCOUNTER — Ambulatory Visit (INDEPENDENT_AMBULATORY_CARE_PROVIDER_SITE_OTHER): Payer: Self-pay | Admitting: Adult Health

## 2022-10-03 DIAGNOSIS — Z0389 Encounter for observation for other suspected diseases and conditions ruled out: Secondary | ICD-10-CM

## 2022-10-03 NOTE — Progress Notes (Signed)
Patient no show appointment. ? ?

## 2022-10-16 ENCOUNTER — Other Ambulatory Visit: Payer: Self-pay | Admitting: Adult Health

## 2022-10-16 DIAGNOSIS — F331 Major depressive disorder, recurrent, moderate: Secondary | ICD-10-CM

## 2022-10-16 DIAGNOSIS — F411 Generalized anxiety disorder: Secondary | ICD-10-CM

## 2022-10-21 ENCOUNTER — Encounter: Payer: Self-pay | Admitting: Adult Health

## 2022-10-21 ENCOUNTER — Ambulatory Visit: Payer: No Typology Code available for payment source | Admitting: Adult Health

## 2022-10-21 DIAGNOSIS — F411 Generalized anxiety disorder: Secondary | ICD-10-CM | POA: Diagnosis not present

## 2022-10-21 DIAGNOSIS — F331 Major depressive disorder, recurrent, moderate: Secondary | ICD-10-CM

## 2022-10-21 MED ORDER — BUPROPION HCL ER (XL) 150 MG PO TB24: 150.0000 mg | ORAL_TABLET | Freq: Every day | ORAL | 5 refills | Status: DC

## 2022-10-21 MED ORDER — SERTRALINE HCL 100 MG PO TABS: 100.0000 mg | ORAL_TABLET | Freq: Every day | ORAL | 5 refills | Status: DC

## 2022-10-21 MED ORDER — ALPRAZOLAM 0.25 MG PO TABS: 0.2500 mg | ORAL_TABLET | Freq: Every evening | ORAL | 2 refills | Status: DC | PRN

## 2022-10-21 NOTE — Progress Notes (Signed)
Gina Watson [id number] [birth year] 62 y.o.  Subjective:   Patient ID:  Gina Watson is a 62 y.o. (DOB 05/28/1960) female.  Chief Complaint: No chief complaint on file.   HPI Gina Watson presents to the office today for follow-up of MDD and GAD.  Describes mood today as "better". Pleasant. Denies tearfulness. Mood symptoms - reports decreased depression, anxiety, and irritability. Reports decreased worry, rumination, and over thinking. Mood is more consistent. Stating "I'm feeling better". Feels like the addition of Wellbutrin has been helpful. Stable and motivation. Taking medications as prescribed. Energy levels improved. Active, does not have a regular exercise routine.   Enjoys some usual interests and activities. Married. Lives with husband. Has 2 adult children - both local - and 2 grandchildren. Mother local. Spending time with family. Appetite adequate. Weight loss - 167 from 169 pounds. Sleeps well most nights. Averages 8 hours. Using a CPAP machine. Focus and concentration improved. Completing tasks. Managing aspects of household. Works full time remotely - [employer] - since 1985. Denies SI or HI.  Denies AH or VH. Denies self harm. Denies substance use. Consumes alcohol socially.  Previous medication trials:  Lexapro, Serzone, Zoloft   Review of Systems:  Review of Systems  Musculoskeletal:  Negative for gait problem.  Neurological:  Negative for tremors.  Psychiatric/Behavioral:         Please refer to HPI    Medications: I have reviewed the patient's current medications.  Current Outpatient Medications  Medication Sig Dispense Refill   ALPRAZolam (XANAX) 0.25 MG tablet Take 1 tablet (0.25 mg total) by mouth at bedtime as needed for sleep. 30 tablet 2   buPROPion (WELLBUTRIN XL) 150 MG 24 hr tablet Take 1 tablet (150 mg total) by mouth daily. 30 tablet 5   BYSTOLIC 5 MG tablet Take 5 mg by mouth daily.     Calcium Carbonate (CALCIUM 600 PO) Take  600 mg by mouth daily.     losartan (COZAAR) 25 MG tablet Take 25 mg by mouth daily.     Multiple Vitamins-Minerals (PRESERVISION/LUTEIN) CAPS Take 1 capsule by mouth 2 (two) times daily.     pravastatin (PRAVACHOL) 40 MG tablet Take 40 mg by mouth daily.     Probiotic Product (PROBIOTIC PO) Take 1 capsule by mouth daily.     sertraline (ZOLOFT) 100 MG tablet Take 1 tablet (100 mg total) by mouth daily. 30 tablet 5   No current facility-administered medications for this visit.    Medication Side Effects: None  Allergies:  Allergies  Allergen Reactions   Hydrocodone Itching and Other (See Comments)    "I lose my voice."   Penicillins Rash    Has patient had a PCN reaction causing immediate rash, facial/tongue/throat swelling, SOB or lightheadedness with hypotension: No Has patient had a PCN reaction causing severe rash involving mucus membranes or skin necrosis: No Has patient had a PCN reaction that required hospitalization Yes Has patient had a PCN reaction occurring within the last 10 years: No If all of the above answers are "NO", then may proceed with Cephalosporin use.    Sulfa Antibiotics Rash    Past Medical History:  Diagnosis Date   High cholesterol    Hypertension     Past Medical History, Surgical history, Social history, and Family history were reviewed and updated as appropriate.   Please see review of systems for further details on the patient's review from today.   Objective:   Physical Exam:  There  were no vitals taken for this visit.  Physical Exam Constitutional:      General: She is not in acute distress. Musculoskeletal:        General: No deformity.  Neurological:     Mental Status: She is alert and oriented to person, place, and time.     Coordination: Coordination normal.  Psychiatric:        Attention and Perception: Attention and perception normal. She does not perceive auditory or visual hallucinations.        Mood and Affect: Mood normal.  Mood is not anxious or depressed. Affect is not labile, blunt, angry or inappropriate.        Speech: Speech normal.        Behavior: Behavior normal.        Thought Content: Thought content normal. Thought content is not paranoid or delusional. Thought content does not include homicidal or suicidal ideation. Thought content does not include homicidal or suicidal plan.        Cognition and Memory: Cognition and memory normal.        Judgment: Judgment normal.     Comments: Insight intact     Lab Review:     Component Value Date/Time   NA 137 10/01/2015 1803   K 4.1 10/01/2015 1803   CL 103 10/01/2015 1803   CO2 24 10/01/2015 1803   GLUCOSE 99 10/01/2015 1803   BUN 19 10/01/2015 1803   CREATININE 0.81 10/01/2015 1803   CALCIUM 9.3 10/01/2015 1803   PROT 7.4 10/01/2015 1803   ALBUMIN 4.2 10/01/2015 1803   AST 29 10/01/2015 1803   ALT 21 10/01/2015 1803   ALKPHOS 62 10/01/2015 1803   BILITOT 0.9 10/01/2015 1803   GFRNONAA >60 10/01/2015 1803   GFRAA >60 10/01/2015 1803       Component Value Date/Time   WBC 16.7 (H) 10/01/2015 1803   RBC 4.71 10/01/2015 1803   HGB 13.9 10/01/2015 1803   HCT 39.2 10/01/2015 1803   PLT 329 10/01/2015 1803   MCV 83.2 10/01/2015 1803   MCH 29.5 10/01/2015 1803   MCHC 35.5 10/01/2015 1803   RDW 12.7 10/01/2015 1803   LYMPHSABS 1.7 10/01/2015 1803   MONOABS 0.7 10/01/2015 1803   EOSABS 0.0 10/01/2015 1803   BASOSABS 0.0 10/01/2015 1803    No results found for: "POCLITH", "LITHIUM"   No results found for: "PHENYTOIN", "PHENOBARB", "VALPROATE", "CBMZ"   .res Assessment: Plan:    Plan:  PDMP reviewed  Wellbutrin XL 150mg  every morning for depression - denies seizure history.  Zoloft 100mg  daily. Xanax 0.25mg  daily as needed for anxiety.   RTC 6 months  Patient advised to contact office with any questions, adverse effects, or acute worsening in signs and symptoms.  Discussed potential benefits, risk, and side effects of  benzodiazepines to include potential risk of tolerance and dependence, as well as possible drowsiness. Advised patient not to drive if experiencing drowsiness and to take lowest possible effective dose to minimize risk of dependence and tolerance.   Time spent with patient was 15 minutes. Greater than 50% of face to face time with patient was spent on counseling and coordination of care.     Diagnoses and all orders for this visit:  Generalized anxiety disorder -     ALPRAZolam (XANAX) 0.25 MG tablet; Take 1 tablet (0.25 mg total) by mouth at bedtime as needed for sleep. -     sertraline (ZOLOFT) 100 MG tablet; Take 1 tablet (100 mg total)  by mouth daily. -     buPROPion (WELLBUTRIN XL) 150 MG 24 hr tablet; Take 1 tablet (150 mg total) by mouth daily.  Major depressive disorder, recurrent episode, moderate (HCC) -     sertraline (ZOLOFT) 100 MG tablet; Take 1 tablet (100 mg total) by mouth daily. -     buPROPion (WELLBUTRIN XL) 150 MG 24 hr tablet; Take 1 tablet (150 mg total) by mouth daily.     Please see After Visit Summary for patient specific instructions.  No future appointments.   No orders of the defined types were placed in this encounter.   -------------------------------

## 2023-01-16 ENCOUNTER — Other Ambulatory Visit: Payer: Self-pay | Admitting: Adult Health

## 2023-01-16 DIAGNOSIS — F411 Generalized anxiety disorder: Secondary | ICD-10-CM

## 2023-01-28 ENCOUNTER — Other Ambulatory Visit: Payer: Self-pay | Admitting: Family Medicine

## 2023-01-28 DIAGNOSIS — Z1231 Encounter for screening mammogram for malignant neoplasm of breast: Secondary | ICD-10-CM

## 2023-02-01 ENCOUNTER — Ambulatory Visit
Admission: RE | Admit: 2023-02-01 | Discharge: 2023-02-01 | Disposition: A | Discharge status: Home / Self Care | Payer: No Typology Code available for payment source | Source: Ambulatory Visit | Attending: Family Medicine | Admitting: Family Medicine

## 2023-02-01 DIAGNOSIS — Z1231 Encounter for screening mammogram for malignant neoplasm of breast: Secondary | ICD-10-CM

## 2023-04-21 ENCOUNTER — Ambulatory Visit: Payer: No Typology Code available for payment source | Admitting: Adult Health

## 2023-04-29 ENCOUNTER — Ambulatory Visit (INDEPENDENT_AMBULATORY_CARE_PROVIDER_SITE_OTHER): Payer: No Typology Code available for payment source | Admitting: Adult Health

## 2023-04-29 ENCOUNTER — Encounter: Payer: Self-pay | Admitting: Adult Health

## 2023-04-29 DIAGNOSIS — F411 Generalized anxiety disorder: Secondary | ICD-10-CM

## 2023-04-29 DIAGNOSIS — F331 Major depressive disorder, recurrent, moderate: Secondary | ICD-10-CM

## 2023-04-29 MED ORDER — ALPRAZOLAM 0.25 MG PO TABS: 0.2500 mg | ORAL_TABLET | Freq: Every evening | ORAL | 2 refills | Status: DC | PRN

## 2023-04-29 MED ORDER — BUPROPION HCL ER (XL) 300 MG PO TB24: 300.0000 mg | ORAL_TABLET | Freq: Every day | ORAL | 2 refills | Status: DC

## 2023-04-29 MED ORDER — SERTRALINE HCL 100 MG PO TABS: 100.0000 mg | ORAL_TABLET | Freq: Every day | ORAL | 3 refills | Status: DC

## 2023-04-29 NOTE — Progress Notes (Signed)
 Gina Watson [id number] 12-30-1960 62 y.o.  Subjective:   Patient ID:  Gina Watson is a 62 y.o. (DOB July 27, 1960) female.  Chief Complaint: No chief complaint on file.   HPI Gina Watson presents to the office today for follow-up of MDD and GAD.  Describes mood today as better. Pleasant. Denies tearfulness. Mood symptoms - reports decreased depression, anxiety, and irritability. Denies panic attacks. Reports some worry, rumination, and over thinking - more situational. Mood is stable. Stating I'm feeling better. Stable and motivation. Taking medications as prescribed. Energy levels improved. Active, does not have a regular exercise routine.   Enjoys some usual interests and activities. Married. Lives with husband. Has 2 adult children - both local - and 2 grandchildren. Mother local. Spending time with family. Appetite adequate. Weight loss - 167 pounds. Sleeps better some nights than others. Using CPAP machine - may need an adjustment. Reports focus and concentration difficulties. Completing tasks. Managing aspects of household. Works full time remotely - [employer] - since 1985. Denies SI or HI.  Denies AH or VH. Denies self harm. Denies substance use. Consumes alcohol socially.  Previous medication trials:  Lexapro, Serzone, Zoloft         Review of Systems:  Review of Systems  Medications: I have reviewed the patient's current medications.  Current Outpatient Medications  Medication Sig Dispense Refill   ALPRAZolam  (XANAX ) 0.25 MG tablet TAKE 1 TABLET (0.25 MG TOTAL) BY MOUTH AT BEDTIME AS NEEDED FOR SLEEP. 30 tablet 1   buPROPion  (WELLBUTRIN  XL) 150 MG 24 hr tablet Take 1 tablet (150 mg total) by mouth daily. 30 tablet 5   BYSTOLIC 5 MG tablet Take 5 mg by mouth daily.     Calcium Carbonate (CALCIUM 600 PO) Take 600 mg by mouth daily.     losartan (COZAAR) 25 MG tablet Take 25 mg by mouth daily.     Multiple Vitamins-Minerals (PRESERVISION/LUTEIN)  CAPS Take 1 capsule by mouth 2 (two) times daily.     pravastatin (PRAVACHOL) 40 MG tablet Take 40 mg by mouth daily.     Probiotic Product (PROBIOTIC PO) Take 1 capsule by mouth daily.     sertraline  (ZOLOFT ) 100 MG tablet Take 1 tablet (100 mg total) by mouth daily. 30 tablet 5   No current facility-administered medications for this visit.    Medication Side Effects: None  Allergies:  Allergies  Allergen Reactions   Hydrocodone Itching and Other (See Comments)    I lose my voice.   Penicillins Rash    Has patient had a PCN reaction causing immediate rash, facial/tongue/throat swelling, SOB or lightheadedness with hypotension: No Has patient had a PCN reaction causing severe rash involving mucus membranes or skin necrosis: No Has patient had a PCN reaction that required hospitalization Yes Has patient had a PCN reaction occurring within the last 10 years: No If all of the above answers are NO, then may proceed with Cephalosporin use.    Sulfa Antibiotics Rash    Past Medical History:  Diagnosis Date   High cholesterol    Hypertension     Past Medical History, Surgical history, Social history, and Family history were reviewed and updated as appropriate.   Please see review of systems for further details on the patient's review from today.   Objective:   Physical Exam:  There were no vitals taken for this visit.  Physical Exam  Lab Review:     Component Value Date/Time   NA 137 10/01/2015 1803  K 4.1 10/01/2015 1803   CL 103 10/01/2015 1803   CO2 24 10/01/2015 1803   GLUCOSE 99 10/01/2015 1803   BUN 19 10/01/2015 1803   CREATININE 0.81 10/01/2015 1803   CALCIUM 9.3 10/01/2015 1803   PROT 7.4 10/01/2015 1803   ALBUMIN 4.2 10/01/2015 1803   AST 29 10/01/2015 1803   ALT 21 10/01/2015 1803   ALKPHOS 62 10/01/2015 1803   BILITOT 0.9 10/01/2015 1803   GFRNONAA >60 10/01/2015 1803   GFRAA >60 10/01/2015 1803       Component Value Date/Time   WBC 16.7 (H)  10/01/2015 1803   RBC 4.71 10/01/2015 1803   HGB 13.9 10/01/2015 1803   HCT 39.2 10/01/2015 1803   PLT 329 10/01/2015 1803   MCV 83.2 10/01/2015 1803   MCH 29.5 10/01/2015 1803   MCHC 35.5 10/01/2015 1803   RDW 12.7 10/01/2015 1803   LYMPHSABS 1.7 10/01/2015 1803   MONOABS 0.7 10/01/2015 1803   EOSABS 0.0 10/01/2015 1803   BASOSABS 0.0 10/01/2015 1803    No results found for: POCLITH, LITHIUM   No results found for: PHENYTOIN, PHENOBARB, VALPROATE, CBMZ   .res Assessment: Plan:    Plan:  PDMP reviewed  Wellbutrin  XL 150mg  every morning for depression - denies seizure history.  Zoloft  100mg  daily. Xanax  0.25mg  daily as needed for anxiety.   RTC 6 months  Patient advised to contact office with any questions, adverse effects, or acute worsening in signs and symptoms.  Discussed potential benefits, risk, and side effects of benzodiazepines to include potential risk of tolerance and dependence, as well as possible drowsiness. Advised patient not to drive if experiencing drowsiness and to take lowest possible effective dose to minimize risk of dependence and tolerance.   Time spent with patient was 15 minutes. Greater than 50% of face to face time with patient was spent on counseling and coordination of care.    There are no diagnoses linked to this encounter.   Please see After Visit Summary for patient specific instructions.  Future Appointments  Date Time Provider Department Center  04/29/2023  5:20 PM Peyten Punches Nattalie, NP [facility] None    No orders of the defined types were placed in this encounter.   -------------------------------

## 2023-05-15 ENCOUNTER — Other Ambulatory Visit: Payer: Self-pay

## 2023-05-15 DIAGNOSIS — F411 Generalized anxiety disorder: Secondary | ICD-10-CM

## 2023-05-16 MED ORDER — ALPRAZOLAM 0.25 MG PO TABS: 0.2500 mg | ORAL_TABLET | Freq: Every evening | ORAL | 0 refills | Status: DC | PRN

## 2023-05-21 ENCOUNTER — Ambulatory Visit: Payer: No Typology Code available for payment source | Admitting: Adult Health

## 2023-05-31 ENCOUNTER — Other Ambulatory Visit: Payer: Self-pay | Admitting: Adult Health

## 2023-05-31 DIAGNOSIS — F411 Generalized anxiety disorder: Secondary | ICD-10-CM

## 2023-05-31 DIAGNOSIS — F331 Major depressive disorder, recurrent, moderate: Secondary | ICD-10-CM

## 2023-06-02 NOTE — Telephone Encounter (Signed)
Please schedule pt an appt lv 12/31 ns in Jan

## 2023-06-04 NOTE — Telephone Encounter (Signed)
 Pt made an appt 06/10/23

## 2023-06-10 ENCOUNTER — Ambulatory Visit: Payer: No Typology Code available for payment source | Admitting: Adult Health

## 2023-06-10 ENCOUNTER — Encounter: Payer: Self-pay | Admitting: Adult Health

## 2023-06-10 DIAGNOSIS — F411 Generalized anxiety disorder: Secondary | ICD-10-CM | POA: Diagnosis not present

## 2023-06-10 DIAGNOSIS — F331 Major depressive disorder, recurrent, moderate: Secondary | ICD-10-CM

## 2023-06-10 MED ORDER — BUPROPION HCL ER (XL) 150 MG PO TB24: 300.0000 mg | ORAL_TABLET | Freq: Every day | ORAL | 1 refills | Status: DC

## 2023-06-10 NOTE — Progress Notes (Signed)
Gina Watson [id number] [birth year] 63 y.o.  Subjective:   Patient ID:  Gina Watson is a 63 y.o. (DOB 1960-10-02) female.  Chief Complaint: No chief complaint on file.   HPI Gina Watson presents to the office today for follow-up of MDD and GAD.  Describes mood today as "ok". Pleasant. Denies tearfulness. Mood symptoms - reports decreased depression, anxiety, and irritability. Reports improved interest and motivation. Denies panic attacks. Reports some worry, rumination, and over thinking situational. Mood is stable. Stating "I feel like I'm doing better". Taking medications as prescribed. Energy levels improved. Active, does not have a regular exercise routine.   Enjoys some usual interests and activities. Married. Lives with husband. Has 2 adult children - both local - and 2 grandchildren. Mother local. Spending time with family. Appetite adequate. Weight stable - 170 pounds. Sleeps better some nights than others. Using CPAP machine. Reports focus and concentration improved. Completing tasks. Managing aspects of household. Works full time remotely - [employer] - since 1985. Denies SI or HI.  Denies AH or VH. Denies self harm. Denies substance use. Consumes alcohol socially.  Previous medication trials:  Lexapro, Serzone, Zoloft    Review of Systems:  Review of Systems  Musculoskeletal:  Negative for gait problem.  Neurological:  Negative for tremors.  Psychiatric/Behavioral:         Please refer to HPI    Medications: I have reviewed the patient's current medications.  Current Outpatient Medications  Medication Sig Dispense Refill   ALPRAZolam (XANAX) 0.25 MG tablet Take 1 tablet (0.25 mg total) by mouth at bedtime as needed for sleep. 90 tablet 0   buPROPion (WELLBUTRIN XL) 300 MG 24 hr tablet TAKE 1 TABLET BY MOUTH EVERY DAY 6 tablet 0   BYSTOLIC 5 MG tablet Take 5 mg by mouth daily.     Calcium Carbonate (CALCIUM 600 PO) Take 600 mg by mouth daily.      losartan (COZAAR) 25 MG tablet Take 25 mg by mouth daily.     Multiple Vitamins-Minerals (PRESERVISION/LUTEIN) CAPS Take 1 capsule by mouth 2 (two) times daily.     pravastatin (PRAVACHOL) 40 MG tablet Take 40 mg by mouth daily.     Probiotic Product (PROBIOTIC PO) Take 1 capsule by mouth daily.     sertraline (ZOLOFT) 100 MG tablet Take 1 tablet (100 mg total) by mouth daily. 90 tablet 3   No current facility-administered medications for this visit.    Medication Side Effects: None  Allergies:  Allergies  Allergen Reactions   Hydrocodone Itching and Other (See Comments)    "I lose my voice."   Penicillins Rash    Has patient had a PCN reaction causing immediate rash, facial/tongue/throat swelling, SOB or lightheadedness with hypotension: No Has patient had a PCN reaction causing severe rash involving mucus membranes or skin necrosis: No Has patient had a PCN reaction that required hospitalization Yes Has patient had a PCN reaction occurring within the last 10 years: No If all of the above answers are "NO", then may proceed with Cephalosporin use.    Sulfa Antibiotics Rash    Past Medical History:  Diagnosis Date   High cholesterol    Hypertension     Past Medical History, Surgical history, Social history, and Family history were reviewed and updated as appropriate.   Please see review of systems for further details on the patient's review from today.   Objective:   Physical Exam:  There were no vitals taken for  this visit.  Physical Exam Constitutional:      General: She is not in acute distress. Musculoskeletal:        General: No deformity.  Neurological:     Mental Status: She is alert and oriented to person, place, and time.     Coordination: Coordination normal.  Psychiatric:        Attention and Perception: Attention and perception normal. She does not perceive auditory or visual hallucinations.        Mood and Affect: Mood normal. Mood is not anxious or  depressed. Affect is not labile, blunt, angry or inappropriate.        Speech: Speech normal.        Behavior: Behavior normal.        Thought Content: Thought content normal. Thought content is not paranoid or delusional. Thought content does not include homicidal or suicidal ideation. Thought content does not include homicidal or suicidal plan.        Cognition and Memory: Cognition and memory normal.        Judgment: Judgment normal.     Comments: Insight intact     Lab Review:     Component Value Date/Time   NA 137 10/01/2015 1803   K 4.1 10/01/2015 1803   CL 103 10/01/2015 1803   CO2 24 10/01/2015 1803   GLUCOSE 99 10/01/2015 1803   BUN 19 10/01/2015 1803   CREATININE 0.81 10/01/2015 1803   CALCIUM 9.3 10/01/2015 1803   PROT 7.4 10/01/2015 1803   ALBUMIN 4.2 10/01/2015 1803   AST 29 10/01/2015 1803   ALT 21 10/01/2015 1803   ALKPHOS 62 10/01/2015 1803   BILITOT 0.9 10/01/2015 1803   GFRNONAA >60 10/01/2015 1803   GFRAA >60 10/01/2015 1803       Component Value Date/Time   WBC 16.7 (H) 10/01/2015 1803   RBC 4.71 10/01/2015 1803   HGB 13.9 10/01/2015 1803   HCT 39.2 10/01/2015 1803   PLT 329 10/01/2015 1803   MCV 83.2 10/01/2015 1803   MCH 29.5 10/01/2015 1803   MCHC 35.5 10/01/2015 1803   RDW 12.7 10/01/2015 1803   LYMPHSABS 1.7 10/01/2015 1803   MONOABS 0.7 10/01/2015 1803   EOSABS 0.0 10/01/2015 1803   BASOSABS 0.0 10/01/2015 1803    No results found for: "POCLITH", "LITHIUM"   No results found for: "PHENYTOIN", "PHENOBARB", "VALPROATE", "CBMZ"   .res Assessment: Plan:    Plan:  PDMP reviewed  Decrease Wellbutrin XL 300mg  to 150mg  every morning for depression - denies seizure history.  Zoloft 100mg  daily. Xanax 0.25mg  daily as needed for anxiety.   RTC 6 months  Patient advised to contact office with any questions, adverse effects, or acute worsening in signs and symptoms.  Discussed potential benefits, risk, and side effects of benzodiazepines  to include potential risk of tolerance and dependence, as well as possible drowsiness. Advised patient not to drive if experiencing drowsiness and to take lowest possible effective dose to minimize risk of dependence and tolerance.   Time spent with patient was 15 minutes. Greater than 50% of face to face time with patient was spent on counseling and coordination of care.   There are no diagnoses linked to this encounter.   Please see After Visit Summary for patient specific instructions.  Future Appointments  Date Time Provider Department Center  06/10/2023  5:00 PM Elanna Bert, Thereasa Solo, NP [facility] None    No orders of the defined types were placed in this encounter.   -------------------------------

## 2023-07-16 ENCOUNTER — Other Ambulatory Visit: Payer: Self-pay | Admitting: Adult Health

## 2023-07-16 DIAGNOSIS — F411 Generalized anxiety disorder: Secondary | ICD-10-CM

## 2023-07-16 DIAGNOSIS — F331 Major depressive disorder, recurrent, moderate: Secondary | ICD-10-CM

## 2023-09-05 ENCOUNTER — Other Ambulatory Visit: Payer: Self-pay | Admitting: Adult Health

## 2023-09-05 DIAGNOSIS — F331 Major depressive disorder, recurrent, moderate: Secondary | ICD-10-CM

## 2023-09-05 DIAGNOSIS — F411 Generalized anxiety disorder: Secondary | ICD-10-CM

## 2023-12-17 ENCOUNTER — Ambulatory Visit (INDEPENDENT_AMBULATORY_CARE_PROVIDER_SITE_OTHER): Payer: No Typology Code available for payment source | Admitting: Adult Health

## 2024-01-02 ENCOUNTER — Other Ambulatory Visit: Payer: Self-pay | Admitting: Adult Health

## 2024-01-02 DIAGNOSIS — F411 Generalized anxiety disorder: Secondary | ICD-10-CM

## 2024-01-02 DIAGNOSIS — F331 Major depressive disorder, recurrent, moderate: Secondary | ICD-10-CM

## 2024-01-16 ENCOUNTER — Ambulatory Visit: Admitting: Adult Health

## 2024-01-16 ENCOUNTER — Encounter: Payer: Self-pay | Admitting: Adult Health

## 2024-01-16 DIAGNOSIS — F411 Generalized anxiety disorder: Secondary | ICD-10-CM

## 2024-01-16 DIAGNOSIS — F331 Major depressive disorder, recurrent, moderate: Secondary | ICD-10-CM | POA: Diagnosis not present

## 2024-01-16 MED ORDER — SERTRALINE HCL 100 MG PO TABS: ORAL_TABLET | ORAL | 1 refills | Status: AC

## 2024-01-16 MED ORDER — BUPROPION HCL ER (XL) 150 MG PO TB24: 150.0000 mg | ORAL_TABLET | Freq: Every day | ORAL | 1 refills | Status: AC

## 2024-01-16 MED ORDER — ALPRAZOLAM 0.25 MG PO TABS: 0.2500 mg | ORAL_TABLET | Freq: Every evening | ORAL | 0 refills | Status: AC | PRN

## 2024-01-16 NOTE — Progress Notes (Addendum)
 Gina Watson [id number] 23-Apr-1961 63 y.o.  Subjective:   Patient ID:  Gina Watson is a 63 y.o. (DOB 1961/01/31) female.  Chief Complaint: No chief complaint on file.   HPI SHYENNE MAGGARD presents to the office today for follow-up of MDD and GAD.  Describes mood today as ok. Pleasant. Denies tearfulness. Mood symptoms - denies depression and irritability. Reports improved interest and motivation. Reports anxiety. Reports feeling overwhelmed. Denies panic attacks. Reports some worry, rumination, and over thinking situational. Reports increased situational stressors - mother with health issues - concerned about her son election really threw him - daughter separated with 2 children. Stating I feel really overwhelmed - on a roller coaster that is going to run away with me. Reports mood is fairly stable. Taking medications as prescribed. Energy levels stable. Active, does not have a regular exercise routine.   Enjoys some usual interests and activities. Married. Lives with husband. Has 2 adult children - both local - and 2 grandchildren. Mother local. Spending time with family. Appetite adequate. Weight gain - 170 to 177 pounds. Sleeps better some nights than others. Averages 6.5 to 7 hours. Reports some daytime napping. Using CPAP machine.  Reports focus and concentration improved. Completing tasks. Managing aspects of household. Works full time remotely - [employer] - since 1985. Denies SI or HI.  Denies AH or VH. Denies self harm. Denies substance use. Consumes alcohol socially.  Previous medication trials:  Lexapro, Serzone, Zoloft          Review of Systems:  Review of Systems  Musculoskeletal:  Negative for gait problem.  Neurological:  Negative for tremors.  Psychiatric/Behavioral:         Please refer to HPI    Medications: I have reviewed the patient's current medications.  Current Outpatient Medications  Medication Sig Dispense Refill   ALPRAZolam   (XANAX ) 0.25 MG tablet Take 1 tablet (0.25 mg total) by mouth at bedtime as needed for sleep. 90 tablet 0   buPROPion  (WELLBUTRIN  XL) 150 MG 24 hr tablet TAKE 1 TABLET BY MOUTH EVERY DAY 90 tablet 1   BYSTOLIC 5 MG tablet Take 5 mg by mouth daily.     Calcium Carbonate (CALCIUM 600 PO) Take 600 mg by mouth daily.     losartan (COZAAR) 25 MG tablet Take 25 mg by mouth daily.     Multiple Vitamins-Minerals (PRESERVISION/LUTEIN) CAPS Take 1 capsule by mouth 2 (two) times daily.     pravastatin (PRAVACHOL) 40 MG tablet Take 40 mg by mouth daily.     Probiotic Product (PROBIOTIC PO) Take 1 capsule by mouth daily.     sertraline  (ZOLOFT ) 100 MG tablet Take 1 tablet (100 mg total) by mouth daily. 90 tablet 3   No current facility-administered medications for this visit.    Medication Side Effects: None  Allergies:  Allergies  Allergen Reactions   Hydrocodone Itching and Other (See Comments)    I lose my voice.   Penicillins Rash    Has patient had a PCN reaction causing immediate rash, facial/tongue/throat swelling, SOB or lightheadedness with hypotension: No Has patient had a PCN reaction causing severe rash involving mucus membranes or skin necrosis: No Has patient had a PCN reaction that required hospitalization Yes Has patient had a PCN reaction occurring within the last 10 years: No If all of the above answers are NO, then may proceed with Cephalosporin use.    Sulfa Antibiotics Rash    Past Medical History:  Diagnosis Date  High cholesterol    Hypertension     Past Medical History, Surgical history, Social history, and Family history were reviewed and updated as appropriate.   Please see review of systems for further details on the patient's review from today.   Objective:   Physical Exam:  There were no vitals taken for this visit.  Physical Exam Constitutional:      General: She is not in acute distress. Musculoskeletal:        General: No deformity.   Neurological:     Mental Status: She is alert and oriented to person, place, and time.     Coordination: Coordination normal.  Psychiatric:        Attention and Perception: Attention and perception normal. She does not perceive auditory or visual hallucinations.        Mood and Affect: Mood normal. Mood is not anxious or depressed. Affect is not labile, blunt, angry or inappropriate.        Speech: Speech normal.        Behavior: Behavior normal.        Thought Content: Thought content normal. Thought content is not paranoid or delusional. Thought content does not include homicidal or suicidal ideation. Thought content does not include homicidal or suicidal plan.        Cognition and Memory: Cognition and memory normal.        Judgment: Judgment normal.     Comments: Insight intact     Lab Review:     Component Value Date/Time   NA 137 10/01/2015 1803   K 4.1 10/01/2015 1803   CL 103 10/01/2015 1803   CO2 24 10/01/2015 1803   GLUCOSE 99 10/01/2015 1803   BUN 19 10/01/2015 1803   CREATININE 0.81 10/01/2015 1803   CALCIUM 9.3 10/01/2015 1803   PROT 7.4 10/01/2015 1803   ALBUMIN 4.2 10/01/2015 1803   AST 29 10/01/2015 1803   ALT 21 10/01/2015 1803   ALKPHOS 62 10/01/2015 1803   BILITOT 0.9 10/01/2015 1803   GFRNONAA >60 10/01/2015 1803   GFRAA >60 10/01/2015 1803       Component Value Date/Time   WBC 16.7 (H) 10/01/2015 1803   RBC 4.71 10/01/2015 1803   HGB 13.9 10/01/2015 1803   HCT 39.2 10/01/2015 1803   PLT 329 10/01/2015 1803   MCV 83.2 10/01/2015 1803   MCH 29.5 10/01/2015 1803   MCHC 35.5 10/01/2015 1803   RDW 12.7 10/01/2015 1803   LYMPHSABS 1.7 10/01/2015 1803   MONOABS 0.7 10/01/2015 1803   EOSABS 0.0 10/01/2015 1803   BASOSABS 0.0 10/01/2015 1803    No results found for: POCLITH, LITHIUM   No results found for: PHENYTOIN, PHENOBARB, VALPROATE, CBMZ   .res Assessment: Plan:    Plan:  PDMP reviewed  Increase Zoloft  100mg  to 150mg   daily for mood symptoms.  Wellbutrin  XL150mg  every morning for depression - denies seizure history.  Xanax  0.25mg  daily as needed for anxiety.   Set up with Marval Bunde.  RTC 6 weeks  Patient advised to contact office with any questions, adverse effects, or acute worsening in signs and symptoms.  Discussed potential benefits, risk, and side effects of benzodiazepines to include potential risk of tolerance and dependence, as well as possible drowsiness. Advised patient not to drive if experiencing drowsiness and to take lowest possible effective dose to minimize risk of dependence and tolerance.   Time spent with patient was 15 minutes. Greater than 50% of face to face time with patient  was spent on counseling and coordination of care.   There are no diagnoses linked to this encounter.   Please see After Visit Summary for patient specific instructions.  Future Appointments  Date Time Provider Department Center  01/16/2024  8:00 AM Gethsemane Fischler Nattalie, NP [facility] None    No orders of the defined types were placed in this encounter.   -------------------------------

## 2024-01-22 ENCOUNTER — Ambulatory Visit: Admitting: Psychiatry

## 2024-01-22 ENCOUNTER — Telehealth: Payer: Self-pay | Admitting: Adult Health

## 2024-01-22 NOTE — Telephone Encounter (Signed)
 Pt LVM @ 11:04a requesting a call back to confirm dosage of Sertraline  she should be taking.  Next appt 10/31

## 2024-01-22 NOTE — Telephone Encounter (Signed)
 Sertraline  HCl 100 MG 1 1/2 tab Orally Once a day; Duration: 100 days please fill when due 07/08/2022

## 2024-01-22 NOTE — Telephone Encounter (Signed)
 Pt called to ask what the sertraline  dose should be and I told her 150 mg - 1-1/2 100 mg tablets. She said her PCP, Alberta Sharps, has been prescribing it and she prescribed 150 mg since at least March. Do you want to increase dose?

## 2024-01-23 ENCOUNTER — Other Ambulatory Visit: Payer: Self-pay

## 2024-01-23 DIAGNOSIS — F411 Generalized anxiety disorder: Secondary | ICD-10-CM

## 2024-01-23 MED ORDER — BUSPIRONE HCL 5 MG PO TABS: 5.0000 mg | ORAL_TABLET | Freq: Three times a day (TID) | ORAL | 0 refills | Status: DC

## 2024-01-23 NOTE — Telephone Encounter (Signed)
Rx sent and patient notified.

## 2024-01-23 NOTE — Telephone Encounter (Signed)
 Copied RF info from PCP.

## 2024-02-12 ENCOUNTER — Ambulatory Visit: Admitting: Psychiatry

## 2024-02-16 ENCOUNTER — Other Ambulatory Visit: Payer: Self-pay | Admitting: Adult Health

## 2024-02-16 DIAGNOSIS — F411 Generalized anxiety disorder: Secondary | ICD-10-CM

## 2024-02-18 ENCOUNTER — Telehealth: Payer: Self-pay | Admitting: Adult Health

## 2024-02-18 NOTE — Telephone Encounter (Signed)
 Pt LVM @ 9:04a requesting a call back from [place].  Next appt 10/31

## 2024-02-18 NOTE — Telephone Encounter (Signed)
Discussed with Almira Coaster.

## 2024-02-27 ENCOUNTER — Encounter: Payer: Self-pay | Admitting: Adult Health

## 2024-02-27 ENCOUNTER — Telehealth (INDEPENDENT_AMBULATORY_CARE_PROVIDER_SITE_OTHER): Admitting: Adult Health

## 2024-02-27 DIAGNOSIS — F331 Major depressive disorder, recurrent, moderate: Secondary | ICD-10-CM

## 2024-02-27 DIAGNOSIS — F411 Generalized anxiety disorder: Secondary | ICD-10-CM

## 2024-02-27 NOTE — Progress Notes (Signed)
 Gina Watson [id number] 12/10/1960 63 y.o.  Virtual Visit via Video Note  I connected with pt @ on 02/27/24 at 11:30 AM EDT by a video enabled telemedicine application and verified that I am speaking with the correct person using two identifiers.   I discussed the limitations of evaluation and management by telemedicine and the availability of in person appointments. The patient expressed understanding and agreed to proceed.  I discussed the assessment and treatment plan with the patient. The patient was provided an opportunity to ask questions and all were answered. The patient agreed with the plan and demonstrated an understanding of the instructions.   The patient was advised to call back or seek an in-person evaluation if the symptoms worsen or if the condition fails to improve as anticipated.  I provided 25 minutes of non-face-to-face time during this encounter.  The patient was located at home.  The provider was located at [facility].   Angeline LOISE Sayers, NP   Subjective:   Patient ID:  Gina Watson is a 63 y.o. (DOB [birth year]) female.  Chief Complaint: No chief complaint on file.   HPI Gina Watson presents for follow-up of MDD and GAD.  Describes mood today as ok. Pleasant. Denies tearfulness. Mood symptoms - reports some depression - sadness - I have moments where I'm not perky feeling. Reports improved interest and motivation. Reports anxiety at times - more when overwhelmed. Denies irritability.   Denies panic attacks. Reports some worry, rumination, and over thinking. Reports increased situational stressors - mother - son - daughter. Stating I feels like I'm doing ok - the lower part of doing ok. Reports mood is stable. Taking medications as prescribed. Energy levels stable. Active, does not have a regular exercise routine.   Enjoys some usual interests and activities. Married. Lives with husband. Has 2 adult children - both local - and 2 grandchildren.  Mother local. Spending time with family. Appetite adequate. Weight gain - 170 to 177 pounds. Sleeps better some nights than others. Averages 6.5 to 7 hours. Reports some daytime napping. Using CPAP machine. Reports focus and concentration improved. Completing tasks. Managing aspects of household. Works full time remotely - [employer] - since 1985. Denies SI or HI.  Denies AH or VH. Denies self harm. Denies substance use. Consumes alcohol socially.  Previous medication trials:  Lexapro, Serzone, Zoloft   Review of Systems:  Review of Systems  Musculoskeletal:  Negative for gait problem.  Neurological:  Negative for tremors.  Psychiatric/Behavioral:         Please refer to HPI    Medications: I have reviewed the patient's current medications.  Current Outpatient Medications  Medication Sig Dispense Refill   ALPRAZolam  (XANAX ) 0.25 MG tablet Take 1 tablet (0.25 mg total) by mouth at bedtime as needed for sleep. 90 tablet 0   buPROPion  (WELLBUTRIN  XL) 150 MG 24 hr tablet Take 1 tablet (150 mg total) by mouth daily. 90 tablet 1   BYSTOLIC 5 MG tablet Take 5 mg by mouth daily.     Calcium Carbonate (CALCIUM 600 PO) Take 600 mg by mouth daily.     losartan (COZAAR) 25 MG tablet Take 25 mg by mouth daily.     Multiple Vitamins-Minerals (PRESERVISION/LUTEIN) CAPS Take 1 capsule by mouth 2 (two) times daily.     pravastatin (PRAVACHOL) 40 MG tablet Take 40 mg by mouth daily.     Probiotic Product (PROBIOTIC PO) Take 1 capsule by mouth daily.  sertraline  (ZOLOFT ) 100 MG tablet Take one and 1/2 tablets daily. 135 tablet 1   No current facility-administered medications for this visit.    Medication Side Effects: None  Allergies:  Allergies  Allergen Reactions   Hydrocodone Itching and Other (See Comments)    I lose my voice.   Penicillins Rash    Has patient had a PCN reaction causing immediate rash, facial/tongue/throat swelling, SOB or lightheadedness with  hypotension: No Has patient had a PCN reaction causing severe rash involving mucus membranes or skin necrosis: No Has patient had a PCN reaction that required hospitalization Yes Has patient had a PCN reaction occurring within the last 10 years: No If all of the above answers are NO, then may proceed with Cephalosporin use.    Sulfa Antibiotics Rash    Past Medical History:  Diagnosis Date   High cholesterol    Hypertension     Family History  Problem Relation Age of Onset   Breast cancer Paternal Grandmother     Social History   Socioeconomic History   Marital status: Married    Spouse name: Not on file   Number of children: Not on file   Years of education: Not on file   Highest education level: Not on file  Occupational History   Not on file  Tobacco Use   Smoking status: Never   Smokeless tobacco: Never  Substance and Sexual Activity   Alcohol use: Yes    Alcohol/week: 1.0 standard drink of alcohol    Types: 1 Glasses of wine per week   Drug use: Not on file   Sexual activity: Not on file  Other Topics Concern   Not on file  Social History Narrative   Not on file   Social Drivers of Health   Financial Resource Strain: Not on file  Food Insecurity: Not on file  Transportation Needs: Not on file  Physical Activity: Not on file  Stress: Not on file  Social Connections: Not on file  Intimate Partner Violence: Not on file    Past Medical History, Surgical history, Social history, and Family history were reviewed and updated as appropriate.   Please see review of systems for further details on the patient's review from today.   Objective:   Physical Exam:  There were no vitals taken for this visit.  Physical Exam Constitutional:      General: She is not in acute distress. Musculoskeletal:        General: No deformity.  Neurological:     Mental Status: She is alert and oriented to person, place, and time.     Coordination: Coordination normal.   Psychiatric:        Attention and Perception: Attention and perception normal. She does not perceive auditory or visual hallucinations.        Mood and Affect: Mood normal. Mood is not anxious or depressed. Affect is not labile, blunt, angry or inappropriate.        Speech: Speech normal.        Behavior: Behavior normal.        Thought Content: Thought content normal. Thought content is not paranoid or delusional. Thought content does not include homicidal or suicidal ideation. Thought content does not include homicidal or suicidal plan.        Cognition and Memory: Cognition and memory normal.        Judgment: Judgment normal.     Comments: Insight intact     Lab Review:  Component Value Date/Time   NA 137 10/01/2015 1803   K 4.1 10/01/2015 1803   CL 103 10/01/2015 1803   CO2 24 10/01/2015 1803   GLUCOSE 99 10/01/2015 1803   BUN 19 10/01/2015 1803   CREATININE 0.81 10/01/2015 1803   CALCIUM 9.3 10/01/2015 1803   PROT 7.4 10/01/2015 1803   ALBUMIN 4.2 10/01/2015 1803   AST 29 10/01/2015 1803   ALT 21 10/01/2015 1803   ALKPHOS 62 10/01/2015 1803   BILITOT 0.9 10/01/2015 1803   GFRNONAA >60 10/01/2015 1803   GFRAA >60 10/01/2015 1803       Component Value Date/Time   WBC 16.7 (H) 10/01/2015 1803   RBC 4.71 10/01/2015 1803   HGB 13.9 10/01/2015 1803   HCT 39.2 10/01/2015 1803   PLT 329 10/01/2015 1803   MCV 83.2 10/01/2015 1803   MCH 29.5 10/01/2015 1803   MCHC 35.5 10/01/2015 1803   RDW 12.7 10/01/2015 1803   LYMPHSABS 1.7 10/01/2015 1803   MONOABS 0.7 10/01/2015 1803   EOSABS 0.0 10/01/2015 1803   BASOSABS 0.0 10/01/2015 1803    No results found for: POCLITH, LITHIUM   No results found for: PHENYTOIN, PHENOBARB, VALPROATE, CBMZ   .res Assessment: Plan:    Plan:  PDMP reviewed  Zoloft  150mg  daily for mood symptoms. Wellbutrin  XL150mg  every morning for depression - denies seizure history.  Xanax  0.25mg  daily as needed for anxiety.   D/C  Buspar  5mg  TID for anxiety - added between appointments, but has not started it.  Plans to see Marval Bunde.  RTC 3 months  Patient advised to contact office with any questions, adverse effects, or acute worsening in signs and symptoms.  Discussed potential benefits, risk, and side effects of benzodiazepines to include potential risk of tolerance and dependence, as well as possible drowsiness. Advised patient not to drive if experiencing drowsiness and to take lowest possible effective dose to minimize risk of dependence and tolerance.   25 minutes spent dedicated to the care of this patient on the date of this encounter to include pre-visit review of records, ordering of medication, post visit documentation, and face-to-face time with the patient discussing depression, anxiety, insomnia and obsessional thoughts. Discussed continuing current medication regimen.    Diagnoses and all orders for this visit:  Major depressive disorder, recurrent episode, moderate (HCC)  Generalized anxiety disorder     Please see After Visit Summary for patient specific instructions.  Future Appointments  Date Time Provider Department Center  03/01/2024  9:00 AM Bunde Sober, LCSW [facility] None    No orders of the defined types were placed in this encounter.     -------------------------------

## 2024-03-01 ENCOUNTER — Ambulatory Visit (INDEPENDENT_AMBULATORY_CARE_PROVIDER_SITE_OTHER): Payer: Self-pay | Admitting: Psychiatry

## 2024-03-01 DIAGNOSIS — F411 Generalized anxiety disorder: Secondary | ICD-10-CM

## 2024-03-01 NOTE — Progress Notes (Signed)
   Patient no-showed for visit today.   Fee is $50.

## 2024-03-02 ENCOUNTER — Ambulatory Visit: Admitting: Psychiatry

## 2024-03-02 DIAGNOSIS — F411 Generalized anxiety disorder: Secondary | ICD-10-CM

## 2024-03-02 NOTE — Progress Notes (Signed)
 [facility] Counsel[facility] Initial Adult Exam  Name: Gina Watson Date: 03/02/2024 MRN: [id number] DOB: 10-11-1960 PCP: Claudene Pellet, MD  Time spent: 60 minutes   Guardian/Payee:  patient    Paperwork requested:  No   Reason for Visit /Presenting Problem: depression, anxiety (primary symptom per patient), stressed, forget at times and has been earlier in her life feeling I may be ADHD  Mental Status Exam:    Appearance:   Casual and Neat     Behavior:  Appropriate, Sharing, and Motivated  Motor:  Normal  Speech/Language:   Clear and Coherent  Affect:  Depressed and anxious  Mood:  anxious and depressed  Thought process:  goal directed  Thought content:    Rumination  Sensory/Perceptual disturbances:    WNL  Orientation:  oriented to person, place, time/date, situation, day of week, month of year, year, and stated date of Nov. 4, 2025, but can easily forget at times  Attention:  Good  Concentration:  Good  Memory:  Pretty good at times but sometimes forget  Fund of knowledge:   Good  Insight:    Fair  Judgment:   Good  Impulse Control:  Good   Reported Symptoms:  see symptoms noted above  Risk Assessment: Danger to Self:  No Self-injurious Behavior: No Danger to Others: No Duty to Warn:no Physical Aggression / Violence:No  Access to Firearms a concern: No  Gang Involvement:No  Patient / guardian was educated about steps to take if suicide or homicide risk level increases between visits: yes While future psychiatric events cannot be accurately predicted, the patient does not currently require acute inpatient psychiatric care and does not currently meet [place]  involuntary commitment criteria.  Substance Abuse History: Current substance abuse: No     Past Psychiatric History:   Previous psychological history is significant for anxiety and depression Outpatient Providers:a long time ago, just after college History of Psych Hospitalization: No   Psychological Testing: n/a   Abuse History: Victim of No., n/a   Report needed: No. Victim of Neglect:No. Perpetrator of n/a  Witness / Exposure to Domestic Violence: No   Protective Services Involvement: No  Witness to [employer] Violence:  No   Family History:  Family History  Problem Relation Age of Onset   Breast cancer Paternal Grandmother     Living situation: the patient lives with their spouse of 40 yrs; have a son age 39 and daughter age 29 and patient is close to both adult children and they both live close by. Grandchildren ages 38 and 2 and has good contact.  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  Religion/Sprituality/World View:   Protestant  Any cultural differences that may affect / interfere with treatment:  not applicable   Recreation/Hobbies: reading, music  Stressors:Loss of father who died in 03-29-2013    Strengths:  Supportive Relationships, Family, Friends, Spirituality, Hopefulness, Journalist, Newspaper, and Able to Communicate Effectively  Barriers:  nothing  Legal History: Pending legal issue / charges: The  patient has no significant history of legal issues. History of legal issue / charges: n/a  Medical History/Surgical History: Patient confirms info below. Past Medical History:  Diagnosis Date   High cholesterol    Hypertension     Past Surgical History:  Procedure Laterality Date   CESAREAN SECTION     CHOLECYSTECTOMY     CYSTOSTOMY W/ BLADDER BIOPSY     TONSILLECTOMY       Medications: Patient confirms info below. Current Outpatient Medications  Medication Sig Dispense Refill   ALPRAZolam  (XANAX ) 0.25 MG tablet Take 1 tablet (0.25 mg total) by mouth at bedtime as needed for sleep. 90 tablet 0   buPROPion  (WELLBUTRIN  XL) 150 MG 24 hr tablet Take 1 tablet (150 mg total) by mouth daily. 90 tablet 1   BYSTOLIC 5 MG tablet Take 5 mg by mouth daily.     Calcium Carbonate (CALCIUM 600 PO) Take 600 mg by mouth daily.     losartan (COZAAR) 25 MG tablet Take 25 mg by mouth daily.     Multiple Vitamins-Minerals (PRESERVISION/LUTEIN) CAPS Take 1 capsule by mouth 2 (two) times daily.     pravastatin (PRAVACHOL) 40 MG tablet Take 40 mg by mouth daily.     Probiotic Product (PROBIOTIC PO) Take 1 capsule by mouth daily.     sertraline  (ZOLOFT ) 100 MG tablet Take one and 1/2 tablets daily. 135 tablet 1   No current facility-administered medications for this visit.    Allergies  Allergen Reactions   Hydrocodone Itching and Other (See Comments)    I lose my voice.   Penicillins Rash    Has patient had a PCN reaction causing immediate rash, facial/tongue/throat swelling, SOB or lightheadedness with hypotension: No Has patient had a PCN reaction causing severe rash involving mucus membranes or skin necrosis: No Has patient had a PCN reaction that required hospitalization Yes Has patient had a PCN reaction occurring within the last 10 years: No If all of the above answers are NO, then may proceed with Cephalosporin use.    Sulfa Antibiotics Rash    Diagnoses:    ICD-10-CM   1. Generalized anxiety disorder  F41.1      Treatment goal plan of care: Worked with patient collaboratively on her treatment plan and she is in agreement with it.  Her goals will remain on treatment plan as patient works with strategies in sessions and outside of sessions to meet her goals.  Patient is signing a copy of her printed treatment goal plan instead of signing online.  Progress  will be assessed each session and documented in the progress or plan sections of treatment note. 1.  Identify life conflicts and/or situations including from the past and the present that currently supports symptomology of anxiety/depression. 2.  Develop behavioral and cognitive strategies to reduce or eliminate excessive anxiety, depression.  Identify, challenge, and replace negative self-talk with more positive realistic and empowering self-talk. 3.  Patient will work on developing reality based, positive cognitive messages that can help her improve her mood and outlook while also working on building/strengthening self-confidence.   Plan of Care:  This is the first appointment with this patient for therapy and today we collaboratively completed her initial evaluation and initial treatment goal plan.  Gina Watson is a 63 year old married for 40 years to her husband. Patient and husband are both employed full time. Patient is not very outgoing socially but do have 1-2 friends, I'm more of an introvert. One adult daughter and  one adult son. No brothers and sisters. Dad is deceased. Mom is still living and does well even though she has some health problems including Stage 4 kidney disease but does pretty well in spite of that. Enjoys reading, going to winery, not much of a traveler, [place] being with her grandchildren, enjoys cooking, is more of a homebody. Has been in therapy twice before in prior years (about 20 yrs ago). Never suicidal. Does engage in negative self-talk, some low self confidence, tends to feel guilty about a lot of things. Guilt drives my depression. At night am sometimes more negative. Negative self-talk. States she wants to feel less negative and weighted down, not so guilty, not so negative, want to be a better person who people and patient feel positive about, less negative self talk, healthier self esteem, less depressed. Denies any SI. One positive noted by patient is that  she does I want to be better.  Review of initial treatment goals above and patient is in agreement.   Next session within 2 weeks.   Barnie Bunde, LCSW

## 2024-03-18 ENCOUNTER — Ambulatory Visit (INDEPENDENT_AMBULATORY_CARE_PROVIDER_SITE_OTHER): Admitting: Psychiatry

## 2024-03-18 DIAGNOSIS — F411 Generalized anxiety disorder: Secondary | ICD-10-CM

## 2024-03-18 NOTE — Progress Notes (Signed)
         Patient no-showed for appt.  Fee is $50.

## 2024-04-05 ENCOUNTER — Ambulatory Visit: Admitting: Psychiatry

## 2024-04-05 DIAGNOSIS — F411 Generalized anxiety disorder: Secondary | ICD-10-CM | POA: Diagnosis not present

## 2024-04-05 NOTE — Progress Notes (Signed)
 [facility] Counselor/Therapist Progress Note  Patient ID: Gina Watson, MRN: [id number],    Date: 04/05/2024  Time Spent: 55 minutes   Treatment Type: Individual Therapy  Virtual Visit via Telehealth Note: MyChartVideo session Connected with patient by a telemedicine/telehealth application, with their informed consent, and verified patient privacy and that I am speaking with the correct person using two identifiers. I discussed the limitations, risks, security and privacy concerns of performing psychotherapy and the availability of in person appointments. I also discussed with the patient that there may be a patient responsible charge related to this service. The patient expressed understanding and agreed to proceed. I discussed the treatment planning with the patient. The patient was provided an opportunity to ask questions and all were answered. The patient agreed with the plan and demonstrated an understanding of the instructions. The patient was advised to call  our office if  symptoms worsen or feel they are in a crisis state and need immediate contact.   Therapist Location: office Patient Location: home    Reported Symptoms: depression, anxiety is primary symptom, stressed, possible ADHD, forgetfulness and easy to misplace items   Mental Status Exam:  Appearance:   Casual     Behavior:  Appropriate, Sharing, and Motivated  Motor:  Normal  Speech/Language:   Clear and Coherent  Affect:  Depressed and anxious  Mood:  anxious and depressed  Thought process:  goal directed  Thought content:    Rumination  Sensory/Perceptual disturbances:    WNL  Orientation:  oriented to person, place, time/date, situation, day of week, month of year, year, and stated date of Dec. 8, 2025  Attention:  Good  Concentration:  Good  Memory:  WNL  Fund of knowledge:   Good  Insight:    Good  Judgment:   Good  Impulse Control:  Good   Risk Assessment: Danger to Self:  No Self-injurious  Behavior: No Danger to Others: No Duty to Warn:no Physical Aggression / Violence:No  Access to Firearms a concern: No  Gang Involvement:No   Subjective: Patient today reporting symptoms of anxiety, stress, some depression. Mother-in-law died since last appt and we were quite close. Talked through some of her grief and mother-in-law's slow painful decline. States mother in law was the person that the family centered around. Due to the suffering involved, she is feeling not as depressed and working through resolving my grief. Looking at wanting to deal better with my anxieties and not sure if it's my mom-guilt, worrying excessively. I worry about everybody else and focus on negative outcomes and sets herself up for fears.  Working in session and recalling some forgetfulness as early as first grade in school, wondering if I'm adhd. Talked more about this in session today and seemed to feel heard and supported. Does state her depression is noticeably better. Denies any substance abuse. Not feeling the pain so much of being depressed. Worrying about her adult kids, and considering *retiring from [employer] which she feels would be helpful for her. Mother has stage 4 kidney disease (75 yrs old). Mother keeps grandchildren and that is getting to be overwhelming. Does butt heads often with mom but loves and cares about her. Having some blindness issues with her vision, left eye worse than right. Feels eventually she won't be able to drive, and that is concerning for her as she processed some of her fears about that today. Dad died in 2013/05/09. Patient and husband both employed full time. Has  2 good friends and 1 of which she talks with often and shares concerns about her mother and other issues that I tend not to talk about. Past history in therapy twice I was dealing more with depression at that time. No thoughts to harm self, even when depressed. Some negative self-talk and working to decrease it.  Feels guilt feeds some depression at times. Does feel meds are helping the depression some.  Discussed some other strategies for patient that she can use to work on self-esteem and also her negative and worrisome outlook.  Will pick up on this further at next session.  Wants better self esteem and is working to improve it.  Shared again today that she wants to be a better person, to feel less negative, have a better sense of her own self-esteem, less anxious and depressed, and feeling better as a person.     Interventions: Cognitive Behavioral Therapy, Solution-Oriented/Positive Psychology, and Ego-Supportive Worked with patient collaboratively on her treatment plan and she is in agreement with it.  Her goals will remain on treatment plan as patient works with strategies in sessions and outside of sessions to meet her goals.  Patient is signing a copy of her printed treatment goal plan instead of signing online.  Progress will be assessed each session and documented in the progress or plan sections of treatment note. 1.  Identify life conflicts and/or situations including from the past and the present that currently supports symptomology of anxiety/depression. 2.  Develop behavioral and cognitive strategies to reduce or eliminate excessive anxiety, depression.  Identify, challenge, and replace negative self-talk with more positive realistic and empowering self-talk. 3.  Patient will work on developing reality based, positive cognitive messages that can help her improve her mood and outlook while also working on building/strengthening self-confidence.    Diagnosis:   ICD-10-CM   1. Generalized anxiety disorder  F41.1       Plan:  Today working in session further on her anxiety, self-esteem, worrying, and depression some related to her personally and within family and beyond in other relationships.  The loss of her mother-in-law was significant and occurred since her last appointment with his  therapist.  She did take the first part of session to do some really good talking and working through some of her thoughts and feelings in regards to mother-in-law's death.  Mother-in-law had been a very special person to her and sounded like she was somewhat of a strong rock in the family.  Patient also talking through some of her own personal goals for wanting to not feel so anxious, depressed, and feeling that she has some control to make some changes.  Discussed how her negative thinking has really gotten in her way and is needing to work on it in order to be able to work further on becoming a happier and more leveled out person, without so much negativity attached.  Has some good understanding about how some of her past history has led her to where she is now.  Feels fortunate that she and her husband have a good marriage.  Did share that her mother is likely going to be coming to live with them on a permanent basis in the future due to her health decline.  Patient thinking about retiring early in 2026 to have more time for family and herself.  Goal review and progress/challenges noted with patient.  Next appointment within 2 to 3 weeks.   Barnie Bunde, LCSW

## 2024-04-10 ENCOUNTER — Other Ambulatory Visit (HOSPITAL_BASED_OUTPATIENT_CLINIC_OR_DEPARTMENT_OTHER): Payer: Self-pay | Admitting: Family Medicine

## 2024-04-10 DIAGNOSIS — Z13820 Encounter for screening for osteoporosis: Secondary | ICD-10-CM

## 2024-04-10 DIAGNOSIS — E2839 Other primary ovarian failure: Secondary | ICD-10-CM
